# Patient Record
Sex: Female | Born: 1955 | Race: Black or African American | Hispanic: No | Smoking: Never smoker
Health system: Southern US, Community
[De-identification: ages and names within clinical notes are randomized; demographics above are authoritative.]

## PROBLEM LIST (undated history)

## (undated) DIAGNOSIS — I10 Essential (primary) hypertension: Secondary | ICD-10-CM

## (undated) DIAGNOSIS — N189 Chronic kidney disease, unspecified: Secondary | ICD-10-CM

## (undated) DIAGNOSIS — Z862 Personal history of diseases of the blood and blood-forming organs and certain disorders involving the immune mechanism: Secondary | ICD-10-CM

## (undated) DIAGNOSIS — M1990 Unspecified osteoarthritis, unspecified site: Secondary | ICD-10-CM

## (undated) DIAGNOSIS — D649 Anemia, unspecified: Secondary | ICD-10-CM

## (undated) HISTORY — PX: POLYPECTOMY: SHX149

## (undated) HISTORY — DX: Anemia, unspecified: D64.9

## (undated) HISTORY — PX: BREAST CYST EXCISION: SHX579

## (undated) HISTORY — PX: HERNIA REPAIR: SHX51

## (undated) HISTORY — DX: Chronic kidney disease, unspecified: N18.9

## (undated) HISTORY — PX: OTHER SURGICAL HISTORY: SHX169

## (undated) HISTORY — PX: KNEE SURGERY: SHX244

## (undated) HISTORY — PX: EXTREMITY CYST EXCISION: SHX1558

---

## 1998-03-13 ENCOUNTER — Emergency Department (HOSPITAL_COMMUNITY): Admission: EM | Admit: 1998-03-13 | Discharge: 1998-03-13 | Payer: Self-pay | Admitting: Emergency Medicine

## 1998-04-10 ENCOUNTER — Encounter: Admission: RE | Admit: 1998-04-10 | Discharge: 1998-06-12 | Payer: Self-pay | Admitting: Family Medicine

## 1998-05-26 ENCOUNTER — Encounter: Admission: RE | Admit: 1998-05-26 | Discharge: 1998-07-06 | Payer: Self-pay | Admitting: Orthopedic Surgery

## 1999-01-18 ENCOUNTER — Emergency Department (HOSPITAL_COMMUNITY): Admission: EM | Admit: 1999-01-18 | Discharge: 1999-01-19 | Payer: Self-pay | Admitting: Emergency Medicine

## 1999-01-19 ENCOUNTER — Encounter: Payer: Self-pay | Admitting: Emergency Medicine

## 1999-02-01 ENCOUNTER — Other Ambulatory Visit: Admission: RE | Admit: 1999-02-01 | Discharge: 1999-02-01 | Payer: Self-pay | Admitting: *Deleted

## 1999-02-22 ENCOUNTER — Ambulatory Visit (HOSPITAL_COMMUNITY): Admission: RE | Admit: 1999-02-22 | Discharge: 1999-02-22 | Payer: Self-pay | Admitting: *Deleted

## 1999-03-03 ENCOUNTER — Ambulatory Visit (HOSPITAL_COMMUNITY): Admission: RE | Admit: 1999-03-03 | Discharge: 1999-03-03 | Payer: Self-pay | Admitting: Orthopedic Surgery

## 2000-04-23 ENCOUNTER — Inpatient Hospital Stay (HOSPITAL_COMMUNITY): Admission: EM | Admit: 2000-04-23 | Discharge: 2000-04-27 | Payer: Self-pay | Admitting: Emergency Medicine

## 2000-04-23 ENCOUNTER — Encounter: Payer: Self-pay | Admitting: Emergency Medicine

## 2000-04-25 ENCOUNTER — Encounter: Payer: Self-pay | Admitting: Pulmonary Disease

## 2000-11-02 ENCOUNTER — Encounter: Payer: Self-pay | Admitting: Family Medicine

## 2000-11-02 ENCOUNTER — Encounter: Admission: RE | Admit: 2000-11-02 | Discharge: 2000-11-02 | Payer: Self-pay | Admitting: Family Medicine

## 2000-11-13 ENCOUNTER — Encounter: Admission: RE | Admit: 2000-11-13 | Discharge: 2000-11-13 | Payer: Self-pay | Admitting: Family Medicine

## 2000-11-13 ENCOUNTER — Encounter: Payer: Self-pay | Admitting: Family Medicine

## 2001-12-11 ENCOUNTER — Encounter (INDEPENDENT_AMBULATORY_CARE_PROVIDER_SITE_OTHER): Payer: Self-pay | Admitting: Cardiology

## 2001-12-11 ENCOUNTER — Ambulatory Visit (HOSPITAL_COMMUNITY): Admission: RE | Admit: 2001-12-11 | Discharge: 2001-12-11 | Payer: Self-pay | Admitting: Family Medicine

## 2004-03-05 ENCOUNTER — Ambulatory Visit (HOSPITAL_COMMUNITY): Admission: RE | Admit: 2004-03-05 | Discharge: 2004-03-05 | Payer: Self-pay | Admitting: *Deleted

## 2004-03-05 ENCOUNTER — Encounter (INDEPENDENT_AMBULATORY_CARE_PROVIDER_SITE_OTHER): Payer: Self-pay | Admitting: *Deleted

## 2005-07-21 ENCOUNTER — Emergency Department (HOSPITAL_COMMUNITY): Admission: EM | Admit: 2005-07-21 | Discharge: 2005-07-21 | Payer: Self-pay | Admitting: Emergency Medicine

## 2005-12-28 ENCOUNTER — Emergency Department (HOSPITAL_COMMUNITY): Admission: EM | Admit: 2005-12-28 | Discharge: 2005-12-28 | Payer: Self-pay | Admitting: Emergency Medicine

## 2006-06-06 ENCOUNTER — Emergency Department (HOSPITAL_COMMUNITY): Admission: EM | Admit: 2006-06-06 | Discharge: 2006-06-06 | Payer: Self-pay | Admitting: Emergency Medicine

## 2006-08-03 ENCOUNTER — Ambulatory Visit (HOSPITAL_COMMUNITY): Admission: RE | Admit: 2006-08-03 | Discharge: 2006-08-03 | Payer: Self-pay | Admitting: Family Medicine

## 2007-02-26 DIAGNOSIS — M67439 Ganglion, unspecified wrist: Secondary | ICD-10-CM | POA: Insufficient documentation

## 2007-12-22 ENCOUNTER — Emergency Department (HOSPITAL_COMMUNITY): Admission: EM | Admit: 2007-12-22 | Discharge: 2007-12-22 | Payer: Self-pay | Admitting: Emergency Medicine

## 2008-09-29 DIAGNOSIS — M179 Osteoarthritis of knee, unspecified: Secondary | ICD-10-CM | POA: Insufficient documentation

## 2009-06-18 ENCOUNTER — Encounter
Admission: RE | Admit: 2009-06-18 | Discharge: 2009-06-18 | Payer: Self-pay | Admitting: Physical Medicine and Rehabilitation

## 2010-07-02 NOTE — Op Note (Signed)
Renee Durham, Renee Durham             ACCOUNT NO.:  [id number]   MEDICAL RECORD NO.:  [id number]          PATIENT TYPE:  AMB   LOCATION:  SDC                           FACILITY:  [facility]   PHYSICIAN:  Pershing Cox, M.D.DATE OF BIRTH:  06/13/1955   DATE OF PROCEDURE:  03/05/2004  DATE OF DISCHARGE:                                 OPERATIVE REPORT   PREOPERATIVE DIAGNOSES:  1.  Menorrhagia.  2.  Hypertension.   POSTOPERATIVE DIAGNOSES:  1.  Menorrhagia.  2.  Hypertension.   PROCEDURES:  1.  Examination under anesthesia.  2.  Dilatation of the cervix and sounding of the uterine cavity.  3.  Diagnostic hysteroscopy.  4.  NovaSure endometrial ablation.   SURGEON:  Pershing Cox, M.D.  No assistants for this case.   ANESTHESIA:  General anesthesia by LMA and paracervical block.   INDICATION FOR PROCEDURE:  Renee Durham is a 55 year old female who has a  significant history of menorrhagia.  In preparation for her surgery, she was  treated with Lupron and became amenorrheic on this medication.  Her surgery  has been postponed several times because of her blood pressure.  She was  seen by the folks at Urgent Care in the last week, who felt that they had  her blood pressure under control and that we could proceed.  She has a long  history of hypertension and has been relatively lax about taking her  medications.  Hysterosalpingogram in my office showed a cavity with no  evidence of submucosal myomas, and for that reason we felt confident to  proceed with this operation.   PROCEDURE:  Renee Durham was brought to the operating room with an IV in  place.  She received 1 g of Ancef in the holding area.  Supine on the OR  table.  IV sedation was administered and an LMA was placed.  Once the  patient was completely anesthetized, she was placed into Allen stirrups and  prepped and draped for a sterile vaginal procedure.  Exam under anesthesia  was performed.  The uterus was  normal size and shape, and there were no  palpable adnexal masses.   A bivalve speculum was placed, the cervix was grasped with a single-tooth  tenaculum.  The Kevorkian curette was used to obtain endocervical  curettings.  The Hegar dilator was then used to sound the depth of the  cervical canal.  The uterine sound was then used to sound the endometrial  cavity.  These two lengths were subtracted from one another, and then the  depth of the cavity was set at 6.0.  Marcaine was then instilled into the  paracervical tissues.  The NovaSure apparatus was placed through the cervix  into the endometrial cavity.  We touched the top of the fundus and then  retreated 2 cm.  The array was opened in the endometrial cavity and then  situated by moving the apparatus up, down, side to side, oblique-angled and  then in a circular fashion.  We then tested the cavity to make sure there  was no evidence of perforation and when  the test passed, we then began the  coagulation procedure.  One  minute and one second were required to complete this.  The array was  successfully closed and the NovaSure was removed.  The hysteroscope was  introduced and the cavity was visualized.  A second picture was taken.  The  patient tolerated the procedure well, and she was taken to the recovery room  in good condition.     Maur   MAJ/MEDQ  D:  03/05/2004  T:  03/05/2004  Job:  [id number]

## 2010-07-02 NOTE — Discharge Summary (Signed)
[facility]  Patient:    Renee Durham, Renee Durham                    MRN: [id number] Adm. Date:  [id number] Disc. Date: 04/27/00 Attending:  Avie Echevaria Dictator:   Earley Favor, RN, MSN, ACNP CC:         Geraldo Pitter, M.D.   Discharge Summary  DATE OF BIRTH:  [birth year].  DISCHARGE DIAGNOSES: 1. Acute hypoxic respiratory failure. 2. Hypertension. 3. History of right adrenal mass.  HISTORY OF PRESENT ILLNESS:  The patient is a 55 year old black female who never smoked, lives in [place], works in a post office, who was found on April 21, 2000 at ______ [facility] on the floor complaining of shortness of breath with a cough and yellow sputum, with questionable duration of her acute event. She denied chest pains and headache.  She was admitted for further evaluation to the intensive care unit at [facility].  LABORATORY AND X-RAY FINDINGS:  Blood culture showed no growth in three days. Arterial blood gas on 4 L nasal cannula:  PCO2 of 39, PO2 of 73, pH 7.50 with a bicarb of 25.3.  WBC was 13.7, hematocrit 27.6, hemoglobin 8.9 and platelets of 368,000.  Sodium was 140, potassium 3.7, chloride of 112, CO2 was 23, glucose 112, BUN 23, creatinine 1.5, calcium 8.7.  ALT was 365, ALP was 87, total bilirubin was 1.5.  Lipase was 131.  CK reached a peak of 199.  CK-MB was 10.3.  TSH was 2.39.  Acute hepatic profile was negative.  Drug screen was negative.  Initial blood gas on 100% non-rebreather:  PCO2 of 34, PO2 of 73, pH 7.48.  Chest x-ray on April 25, 2000 shows resolving pulmonary edema with an enlarged cardiac silhouette.  CT of the head on April 25, 2000 showed no intracranial hemorrhage or cortical infarct.  Chest x-ray on April 24, 2000 shows slight improvement in aeration, bilateral airspace disease persisting.  Differential between pneumonia and vascular congestion persists.  Low lung volumes are still present.  Two-dimensional  echocardiogram performed on April 24, 2000 demonstrated left ventricle was small, left ventricular ejection fraction 65%, no left ventricular or regional wall motion abnormalities, normal mitral valve, mild right ventricular dilatation, excellent LV function.  There is significant LVH.  Twelve-lead EKG shows normal sinus rhythm without acute processes.  HOSPITAL COURSE: #1 - ACUTE HYPOXIC RESPIRATORY FAILURE:  The patient was admitted to [facility] in acute respiratory failure.  She initially required high-flow oxygen to maintain saturations greater than 92%.  She rapidly improved and by day three of hospitalization, no longer requiring supplemental oxygen, with O2 saturations of 93%.  She reached maximal hospital benefit by April 27, 2000 and was discharged home.  #2 - HYPERTENSION:  Ms. Thoreson has a known history of hypertension and is followed by Dr. Geraldo Pitter.  She has not taken any medications for her hypertension for approximately six months.  She was placed on clonidine initially at 0.1 mg b.i.d. and increased to 0.2 mg b.i.d., with her blood pressure coming down into the 140/80 range with a heart rate of 70.  She will be followed on an outpatient basis by Dr. Renaye Rakers to address her hypertension.  #3 - RIGHT POSTERIOR SUBSEROSAL FIBROMA:  She has a history of a right posterior subserosal uterine fibroid, probable mild adenomyosis.  This was evaluated by ______ in December 2000.  She has been instructed she  needs to follow up for further evaluation of this with ______ or with Dr. Parke Simmers in the future.  Of note, she has been instructed that if she continues her noncompliant behavior, she will continue to suffer further consequences of her actions.  DISCHARGE MEDICATIONS:  She should be on clonidine 0.2 mg one b.i.d.  DIET:  She should be on a low-fat, no-salt diet.  FOLLOWUP:  She is to schedule a followup with Dr. Parke Simmers within two weeks.  She will not be  required to follow up with pulmonary critical care at this time.  DISPOSITION/CONDITION ON DISCHARGE:  Acute respiratory hypoxia has been reversed.  Hypertension is now controlled.  She remains morbidly obese at last addressed weight of 276 pounds. DD:  04/27/00 TD:  04/27/00 Job: [id number]

## 2010-08-24 ENCOUNTER — Encounter: Payer: Self-pay | Admitting: Emergency Medicine

## 2010-08-24 ENCOUNTER — Emergency Department (INDEPENDENT_AMBULATORY_CARE_PROVIDER_SITE_OTHER): Payer: No Typology Code available for payment source

## 2010-08-24 ENCOUNTER — Emergency Department (HOSPITAL_BASED_OUTPATIENT_CLINIC_OR_DEPARTMENT_OTHER)
Admission: EM | Admit: 2010-08-24 | Discharge: 2010-08-24 | Disposition: A | Discharge status: Home / Self Care | Payer: No Typology Code available for payment source | Attending: Emergency Medicine | Admitting: Emergency Medicine

## 2010-08-24 DIAGNOSIS — S8990XA Unspecified injury of unspecified lower leg, initial encounter: Secondary | ICD-10-CM

## 2010-08-24 DIAGNOSIS — Z8739 Personal history of other diseases of the musculoskeletal system and connective tissue: Secondary | ICD-10-CM | POA: Insufficient documentation

## 2010-08-24 DIAGNOSIS — X500XXA Overexertion from strenuous movement or load, initial encounter: Secondary | ICD-10-CM

## 2010-08-24 DIAGNOSIS — S99929A Unspecified injury of unspecified foot, initial encounter: Secondary | ICD-10-CM

## 2010-08-24 DIAGNOSIS — S8390XA Sprain of unspecified site of unspecified knee, initial encounter: Secondary | ICD-10-CM

## 2010-08-24 DIAGNOSIS — IMO0002 Reserved for concepts with insufficient information to code with codable children: Secondary | ICD-10-CM | POA: Insufficient documentation

## 2010-08-24 DIAGNOSIS — I10 Essential (primary) hypertension: Secondary | ICD-10-CM | POA: Insufficient documentation

## 2010-08-24 HISTORY — DX: Essential (primary) hypertension: I10

## 2010-08-24 HISTORY — DX: Unspecified osteoarthritis, unspecified site: M19.90

## 2010-08-24 HISTORY — DX: Morbid (severe) obesity due to excess calories: E66.01

## 2010-08-24 MED ORDER — OXYCODONE-ACETAMINOPHEN 5-325 MG PO TABS: 2.0000 | ORAL_TABLET | ORAL | Status: AC | PRN

## 2010-08-24 NOTE — Discharge Instructions (Signed)
Knee Sprain You have a knee sprain. Sprains are painful injuries to the joints. They are a partial or complete tearing of ligaments. Ligaments are tough, fibrous tissues that hold bones together at the joints. A strain (sprain) has occurred when a ligament is stretched or damaged. This injury may take several weeks to heal. This is often the same length of time as a bone fracture (break in bone) takes to heal. Even though a fracture (bone break) may not have occurred, the recovery times may be similar. HOME CARE INSTRUCTIONS  Rest the injured area for 1 day. Then slowly start using the joint as directed by your caregiver and as the pain allows. Use crutches as directed. If the knee was splinted or casted, continue use and care as directed. If an ace bandage has been applied today, it should be removed and reapplied every 3 to 4 hours. It should not be applied tightly, but firmly enough to keep swelling down. Watch toes and feet for swelling, bluish discoloration, coldness, numbness or excessive pain. If any of these symptoms occur, remove the ace bandage and reapply more loosely. If these symptoms persist, seek medical attention.   For the first 24 hours, lie down. Keep the injured extremity elevated on two pillows.   Apply ice to the injured area for 10 minutes every couple hours. Repeat this 4 times per day for the first 48 hours. Put the ice in a plastic bag and place a towel between the bag of ice and your skin.   Wear any splinting, casting, or elastic bandage applications as instructed.   Only take over-the-counter or prescription medicines for pain, discomfort, or fever as directed by your caregiver. Do not use aspirin immediately after the injury unless instructed by your caregiver. Aspirin can cause increased bleeding and bruising of the tissues.   If you were given crutches, continue to use them as instructed. Do not resume weight bearing on the affected extremity until instructed.   1.  2. Persistent pain and inability to use the injured area as directed for more than 2 to 3 days are warning signs. If this happens you should see a caregiver for a follow-up visit as soon as possible. Initially, a hairline fracture (this is the same as a broken bone) may not be evident on x-rays. Persistent pain and swelling indicate that further evaluation, non-weight bearing (use of crutches as instructed), and/or further x-rays are indicated. X-rays may sometimes not show a small fracture until a week or ten days later. Make a follow-up appointment with your own caregiver or one to whom we have referred you. A radiologist (specialist in reading x-rays) may re-read your X-rays. Make sure you know how you are to get your x-ray results. Do not assume everything is normal if you do not hear from [place].  3.  SEEK MEDICAL CARE IF:  Bruising, swelling, or pain increases.   You have cold or numb toes   You have continuing difficulty or pain with walking.  1.  2. SEEK IMMEDIATE MEDICAL CARE IF:   Your toes are cold, numb or blue.   The pain is not responding to medications and continues to stay the same or get worse.  MAKE SURE YOU:   Understand these instructions.   Will watch your condition.   Will get help right away if you are not doing well or get worse.  Document Released: 01/31/2005 Document Re-Released: 04/29/2008 [facility] Patient Information 2011 [place], [place].

## 2010-08-24 NOTE — ED Provider Notes (Signed)
History     Chief Complaint  Patient presents with  . Knee Pain   Patient is a 55 y.o. female presenting with knee pain.  Knee Pain This is a recurrent problem. The current episode started less than 1 hour ago. The problem occurs constantly. The problem has not changed since onset.Pertinent negatives include no chest pain. The symptoms are aggravated by bending, twisting and walking.  right knee pain after twisting it getting in a car. History of knee problems and knee surgery.   Past Medical History  Diagnosis Date  . Hypertension   . Obesities, morbid   . Arthritis     Past Surgical History  Procedure Date  . Hernia repair   . Knee surgery   . Extremity cyst excision   . Breast cyst excision     History reviewed. No pertinent family history.  History  Substance Use Topics  . Smoking status: Never Smoker   . Smokeless tobacco: Not on file  . Alcohol Use: No    OB History    Grav Para Term Preterm Abortions TAB SAB Ect Mult Living                  Review of Systems  Constitutional: Negative for appetite change.  Cardiovascular: Negative for chest pain and leg swelling.  Musculoskeletal: Positive for joint swelling. Negative for back pain.  Neurological: Negative for weakness and numbness.    Physical Exam  BP 169/97  Pulse 89  Temp(Src) 98.1 F (36.7 C) (Oral)  Resp 18  SpO2 96%  Physical Exam  Constitutional: She appears well-developed.  Non-toxic appearance. She does not have a sickly appearance.  HENT:  Head: Normocephalic.  Musculoskeletal: She exhibits tenderness.       Right knee pain. Pain with movement. NV intact distally.     ED Course  Procedures  MDM Knee sprain. Xray no fracture. Patient too large for immobilizer. Pain meds.       Juliet Rude. Rubin Payor, MD 08/24/10 [phone]

## 2010-10-04 DIAGNOSIS — M545 Low back pain, unspecified: Secondary | ICD-10-CM | POA: Insufficient documentation

## 2010-11-17 LAB — CBC
HCT: 35.4 — ABNORMAL LOW
MCHC: 31.9
MCV: 71.9 — ABNORMAL LOW
Platelets: 307
RDW: 19.4 — ABNORMAL HIGH

## 2010-11-17 LAB — DIFFERENTIAL
Basophils Absolute: 0
Lymphocytes Relative: 23
Monocytes Relative: 10
Neutro Abs: 4.7

## 2010-11-17 LAB — COMPREHENSIVE METABOLIC PANEL
Albumin: 3.4 — ABNORMAL LOW
BUN: 9
Calcium: 8.7
Chloride: 109
Creatinine, Ser: 0.98
Total Bilirubin: 0.6
Total Protein: 6.5

## 2011-01-03 DIAGNOSIS — M25569 Pain in unspecified knee: Secondary | ICD-10-CM | POA: Insufficient documentation

## 2011-07-06 ENCOUNTER — Other Ambulatory Visit: Payer: Self-pay | Admitting: Physical Medicine and Rehabilitation

## 2011-07-06 DIAGNOSIS — M48061 Spinal stenosis, lumbar region without neurogenic claudication: Secondary | ICD-10-CM

## 2011-07-18 ENCOUNTER — Ambulatory Visit
Admission: RE | Admit: 2011-07-18 | Discharge: 2011-07-18 | Disposition: A | Discharge status: Home / Self Care | Payer: No Typology Code available for payment source | Source: Ambulatory Visit | Attending: Physical Medicine and Rehabilitation | Admitting: Physical Medicine and Rehabilitation

## 2011-07-18 DIAGNOSIS — M48061 Spinal stenosis, lumbar region without neurogenic claudication: Secondary | ICD-10-CM

## 2013-06-07 DIAGNOSIS — N183 Chronic kidney disease, stage 3 unspecified: Secondary | ICD-10-CM | POA: Insufficient documentation

## 2013-12-09 ENCOUNTER — Encounter: Payer: Self-pay | Admitting: Internal Medicine

## 2014-01-23 ENCOUNTER — Encounter: Payer: Self-pay | Admitting: Internal Medicine

## 2014-02-03 ENCOUNTER — Encounter: Payer: No Typology Code available for payment source | Admitting: Internal Medicine

## 2014-03-17 ENCOUNTER — Telehealth: Payer: Self-pay

## 2014-03-17 ENCOUNTER — Ambulatory Visit (AMBULATORY_SURGERY_CENTER): Payer: Self-pay | Admitting: *Deleted

## 2014-03-17 VITALS — Ht 61.5 in | Wt 320.0 lb

## 2014-03-17 DIAGNOSIS — Z1211 Encounter for screening for malignant neoplasm of colon: Secondary | ICD-10-CM

## 2014-03-17 MED ORDER — MOVIPREP 100 G PO SOLR: 1.0000 | Freq: Once | ORAL | Status: DC

## 2014-03-17 NOTE — Telephone Encounter (Signed)
Pt had previsit today and has BMI of 59 and needs to be scheduled at the hospital. Do you just want her scheduled for your next hospital week? Please advise.

## 2014-03-17 NOTE — Progress Notes (Signed)
Denies allergies to eggs or soy products. Denies complications with sedation or anesthesia. Denies O2 use. Denies use of diet or weight loss medications.  Emmi instructions not given for colonoscopy as patient does not have ready access to internet.

## 2014-03-17 NOTE — Telephone Encounter (Signed)
[place] for next hospital week with MAC

## 2014-03-19 ENCOUNTER — Other Ambulatory Visit: Payer: Self-pay

## 2014-03-19 DIAGNOSIS — Z1211 Encounter for screening for malignant neoplasm of colon: Secondary | ICD-10-CM

## 2014-03-19 NOTE — Telephone Encounter (Signed)
Pt scheduled for colon at [facility] 04/21/14@10 :30am. Pt to arrive there at 9am. Left message for pt to call back.

## 2014-03-19 NOTE — Telephone Encounter (Signed)
Spoke with pt and she is aware of appt, new instructions mailed to pt.

## 2014-04-07 ENCOUNTER — Encounter: Payer: No Typology Code available for payment source | Admitting: Internal Medicine

## 2014-04-15 ENCOUNTER — Encounter (HOSPITAL_COMMUNITY): Payer: Self-pay | Admitting: *Deleted

## 2014-04-21 ENCOUNTER — Ambulatory Visit (HOSPITAL_COMMUNITY): Payer: No Typology Code available for payment source | Admitting: Anesthesiology

## 2014-04-21 ENCOUNTER — Encounter (HOSPITAL_COMMUNITY): Payer: Self-pay | Admitting: Gastroenterology

## 2014-04-21 ENCOUNTER — Ambulatory Visit (HOSPITAL_COMMUNITY)
Admission: RE | Admit: 2014-04-21 | Discharge: 2014-04-21 | Disposition: A | Discharge status: Home / Self Care | Payer: No Typology Code available for payment source | Source: Ambulatory Visit | Attending: Internal Medicine | Admitting: Internal Medicine

## 2014-04-21 ENCOUNTER — Encounter (HOSPITAL_COMMUNITY)
Admission: RE | Disposition: A | Discharge status: Home / Self Care | Payer: Self-pay | Source: Ambulatory Visit | Attending: Internal Medicine

## 2014-04-21 DIAGNOSIS — M13861 Other specified arthritis, right knee: Secondary | ICD-10-CM | POA: Diagnosis not present

## 2014-04-21 DIAGNOSIS — D122 Benign neoplasm of ascending colon: Secondary | ICD-10-CM | POA: Insufficient documentation

## 2014-04-21 DIAGNOSIS — I10 Essential (primary) hypertension: Secondary | ICD-10-CM | POA: Diagnosis not present

## 2014-04-21 DIAGNOSIS — M4690 Unspecified inflammatory spondylopathy, site unspecified: Secondary | ICD-10-CM | POA: Diagnosis not present

## 2014-04-21 DIAGNOSIS — D127 Benign neoplasm of rectosigmoid junction: Secondary | ICD-10-CM | POA: Insufficient documentation

## 2014-04-21 DIAGNOSIS — Z6841 Body Mass Index (BMI) 40.0 and over, adult: Secondary | ICD-10-CM | POA: Diagnosis not present

## 2014-04-21 DIAGNOSIS — Z79899 Other long term (current) drug therapy: Secondary | ICD-10-CM | POA: Diagnosis not present

## 2014-04-21 DIAGNOSIS — M13862 Other specified arthritis, left knee: Secondary | ICD-10-CM | POA: Diagnosis not present

## 2014-04-21 DIAGNOSIS — K648 Other hemorrhoids: Secondary | ICD-10-CM | POA: Insufficient documentation

## 2014-04-21 DIAGNOSIS — Z1211 Encounter for screening for malignant neoplasm of colon: Secondary | ICD-10-CM | POA: Diagnosis not present

## 2014-04-21 HISTORY — DX: Personal history of diseases of the blood and blood-forming organs and certain disorders involving the immune mechanism: Z86.2

## 2014-04-21 HISTORY — PX: COLONOSCOPY: SHX5424

## 2014-04-21 SURGERY — COLONOSCOPY
Anesthesia: Monitor Anesthesia Care

## 2014-04-21 MED ORDER — LACTATED RINGERS IV SOLN
INTRAVENOUS | Status: DC | PRN
  Administered 2014-04-21: 11:00:00 via INTRAVENOUS

## 2014-04-21 MED ORDER — PROPOFOL 10 MG/ML IV BOLUS
INTRAVENOUS | Status: AC
  Filled 2014-04-21: qty 20

## 2014-04-21 MED ORDER — SODIUM CHLORIDE 0.9 % IV SOLN: INTRAVENOUS | Status: DC

## 2014-04-21 MED ORDER — LACTATED RINGERS IV SOLN: INTRAVENOUS | Status: DC

## 2014-04-21 MED ORDER — KETAMINE HCL 10 MG/ML IJ SOLN
INTRAMUSCULAR | Status: DC | PRN
  Administered 2014-04-21: 20 mg via INTRAVENOUS

## 2014-04-21 MED ORDER — ONDANSETRON HCL 4 MG/2ML IJ SOLN
INTRAMUSCULAR | Status: DC | PRN
  Administered 2014-04-21: 4 mg via INTRAVENOUS

## 2014-04-21 MED ORDER — PROPOFOL INFUSION 10 MG/ML OPTIME
INTRAVENOUS | Status: DC | PRN
  Administered 2014-04-21: 100 ug/kg/min via INTRAVENOUS

## 2014-04-21 NOTE — Anesthesia Preprocedure Evaluation (Signed)
Anesthesia Evaluation  Patient identified by MRN, date of birth, ID band Patient awake    Reviewed: Allergy & Precautions, NPO status , Patient's Chart, lab work & pertinent test results  Airway Mallampati: II  TM Distance: >3 FB Neck ROM: Full    Dental no notable dental hx.    Pulmonary neg pulmonary ROS,  breath sounds clear to auscultation  Pulmonary exam normal       Cardiovascular hypertension, Pt. on medications Rhythm:Regular Rate:Normal     Neuro/Psych negative neurological ROS  negative psych ROS   GI/Hepatic negative GI ROS, Neg liver ROS,   Endo/Other  Morbid obesity  Renal/GU negative Renal ROS  negative genitourinary   Musculoskeletal negative musculoskeletal ROS (+)   Abdominal   Peds negative pediatric ROS (+)  Hematology negative hematology ROS (+)   Anesthesia Other Findings   Reproductive/Obstetrics negative OB ROS                             Anesthesia Physical Anesthesia Plan  ASA: IV  Anesthesia Plan: MAC   Post-op Pain Management:    Induction:   Airway Management Planned: Simple Face Mask  Additional Equipment:   Intra-op Plan:   Post-operative Plan:   Informed Consent: I have reviewed the patients History and Physical, chart, labs and discussed the procedure including the risks, benefits and alternatives for the proposed anesthesia with the patient or authorized representative who has indicated his/her understanding and acceptance.   Dental advisory given  Plan Discussed with: CRNA  Anesthesia Plan Comments:         Anesthesia Quick Evaluation

## 2014-04-21 NOTE — Discharge Instructions (Signed)

## 2014-04-21 NOTE — Anesthesia Postprocedure Evaluation (Signed)
  Anesthesia Post-op Note  Patient: Renee Durham  Procedure(s) Performed: Procedure(s) (LRB): COLONOSCOPY (N/A)  Patient Location: PACU  Anesthesia Type: MAC  Level of Consciousness: awake and alert   Airway and Oxygen Therapy: Patient Spontanous Breathing  Post-op Pain: mild  Post-op Assessment: Post-op Vital signs reviewed, Patient's Cardiovascular Status Stable, Respiratory Function Stable, Patent Airway and No signs of Nausea or vomiting  Last Vitals:  Filed Vitals:   04/21/14 1220  BP: 143/91  Pulse: 52  Temp:   Resp: 10    Post-op Vital Signs: stable   Complications: No apparent anesthesia complications

## 2014-04-21 NOTE — H&P (Signed)
HPI: Renee Durham is a 59 year old female with a past history of morbid obesity, hypertension who presents today for outpatient colonoscopy for screening. This procedure is being performed in the outpatient hospital setting due to morbid obesity and BMI greater than 50. She reports normal bowel habits without change in her bowel habits, no blood in her stool or melena. No diarrhea or constipation. No family history of colorectal cancer. She has never had a colonoscopy. She reports history of prior umbilical hernia repair which was uncomplicated  Past Medical History  Diagnosis Date  . Hypertension   . Obesities, morbid   . Arthritis     knees/back  . H/O sickle cell trait     Past Surgical History  Procedure Laterality Date  . Knee surgery      scope  . Extremity cyst excision Right     right wrist  . Breast cyst excision    . Uterine ablation    . Hernia repair      umbilical     (Not in an outpatient encounter)  No Known Allergies  Family History  Problem Relation Age of Onset  . Colon cancer Neg Hx   . Esophageal cancer Neg Hx   . Rectal cancer Neg Hx   . Stomach cancer Neg Hx     History  Substance Use Topics  . Smoking status: Never Smoker   . Smokeless tobacco: Never Used  . Alcohol Use: No    ROS: As per history of present illness, otherwise negative  BP 153/79 mmHg  Pulse 62  Temp(Src) 97.5 F (36.4 C) (Oral)  Resp 24  Ht 5' 1"  (1.549 m)  Wt 320 lb (145.151 kg)  BMI 60.49 kg/m2  SpO2 98% Gen: awake, alert, NAD HEENT: anicteric, op clear CV: RRR, no mrg Pulm: CTA b/l Abd: soft, obese, NT/ND, +BS throughout Ext: no c/c/e Neuro: nonfocal   RELEVANT LABS AND IMAGING: CBC    Component Value Date/Time   WBC 7.4 12/22/2007 1825   RBC 4.92 12/22/2007 1825   HGB 11.3* 12/22/2007 1825   HCT 35.4* 12/22/2007 1825   PLT 307 12/22/2007 1825   MCV 71.9* 12/22/2007 1825   MCHC 31.9 12/22/2007 1825   RDW 19.4* 12/22/2007 1825   LYMPHSABS 1.7  12/22/2007 1825   MONOABS 0.7 12/22/2007 1825   EOSABS 0.3 12/22/2007 1825   BASOSABS 0.0 12/22/2007 1825    CMP     Component Value Date/Time   NA 141 12/22/2007 1825   K 3.9 12/22/2007 1825   CL 109 12/22/2007 1825   CO2 29 12/22/2007 1825   GLUCOSE 103* 12/22/2007 1825   BUN 9 12/22/2007 1825   CREATININE 0.98 12/22/2007 1825   CALCIUM 8.7 12/22/2007 1825   PROT 6.5 12/22/2007 1825   ALBUMIN 3.4* 12/22/2007 1825   AST 16 12/22/2007 1825   ALT 13 12/22/2007 1825   ALKPHOS 80 12/22/2007 1825   BILITOT 0.6 12/22/2007 1825   GFRNONAA >60 12/22/2007 1825   GFRAA  12/22/2007 1825    >60        The eGFR has been calculated using the MDRD equation. This calculation has not been validated in all clinical    ASSESSMENT/PLAN: 59 year old present for outpatient screening colonoscopy  1. CRC screening -- colonoscopy plan today for screening. This is her first colonoscopy.The nature of the procedure, as well as the risks, benefits, and alternatives were carefully and thoroughly reviewed with the patient. Ample time for discussion and questions allowed. The patient understood,  was satisfied, and agreed to proceed.

## 2014-04-21 NOTE — Op Note (Signed)
[facility] [place], [address]   COLONOSCOPY PROCEDURE REPORT  PATIENT: Renee, Durham  MR#: [id number] BIRTHDATE: March 25, 1955 , 46  yrs. old GENDER: female ENDOSCOPIST: Jerene Bears, MD REFERRED DD:UKGUR Bouska, M.D. PROCEDURE DATE:  04/21/2014 PROCEDURE:   Colonoscopy with snare polypectomy First Screening Colonoscopy - Avg.  risk and is 50 yrs.  old or older Yes.  Prior Negative Screening - Now for repeat screening. N/A  History of Adenoma - Now for follow-up colonoscopy & has been > or = to 3 yrs.  N/A  Polyps Removed Today? Yes. ASA CLASS:   Class III INDICATIONS:average risk patient for colon cancer and 1st colonoscopy. MEDICATIONS: Monitored anesthesia care and Per Anesthesia  DESCRIPTION OF PROCEDURE:   After the risks benefits and alternatives of the procedure were thoroughly explained, informed consent was obtained.  The digital rectal exam revealed no rectal mass.   The Pentax Ped Colon [id number]  endoscope was introduced through the anus and advanced to the cecum, which was identified by both the appendix and ileocecal valve. No adverse events experienced.   The quality of the prep was , using MoviPrep good. The instrument was then slowly withdrawn as the colon was fully examined.  COLON FINDINGS: A sessile polyp measuring 8 mm in size with a mucous cap was found in the ascending colon.  A polypectomy was performed with a cold snare.  The resection was complete, the polyp tissue was completely retrieved and sent to histology.   Three sessile polyps ranging from 4 to 85mm in size were found in the rectosigmoid colon.  Polypectomies were performed with a cold snare.  The resection was complete, the polyp tissue was completely retrieved and sent to histology.   The examination was otherwise normal. Retroflexed views revealed internal hemorrhoids. The time to cecum = 11 Withdrawal time = 14   The scope was withdrawn and the procedure  completed. COMPLICATIONS: There were no immediate complications.  ENDOSCOPIC IMPRESSION: 1.   Sessile polyp was found in the ascending colon; polypectomy was performed with a cold snare 2.   Three sessile polyps ranging from 4 to 43mm in size were found in the rectosigmoid colon; polypectomies were performed with a cold snare 3.   The examination was otherwise normal  RECOMMENDATIONS: 1.  Avoid all NSAIDS for the next 2 weeks. 2.  Await pathology results 3.  Timing of repeat colonoscopy will be determined by pathology findings. 4.  You will receive a letter within 1-2 weeks with the results of your biopsy as well as final recommendations.  Please call my office if you have not received a letter after 3 weeks.  eSigned:  Jerene Bears, MD 04/21/2014 11:35 AMrcc: Bernerd Limbo, MD and The Patient

## 2014-04-21 NOTE — Transfer of Care (Signed)
Immediate Anesthesia Transfer of Care Note  Patient: Renee Durham  Procedure(s) Performed: Procedure(s): COLONOSCOPY (N/A)  Patient Location: Endoscopy Unit  Anesthesia Type:MAC  Level of Consciousness: awake, alert  and oriented  Airway & Oxygen Therapy: Patient Spontanous Breathing and Patient connected to face mask oxygen  Post-op Assessment: Report given to RN  Post vital signs: Reviewed and stable  Last Vitals:  Filed Vitals:   04/21/14 1024  BP: 153/79  Pulse: 62  Temp: 36.4 C  Resp: 24    Complications: No apparent anesthesia complications

## 2014-04-22 ENCOUNTER — Encounter (HOSPITAL_COMMUNITY): Payer: Self-pay | Admitting: Internal Medicine

## 2014-04-24 ENCOUNTER — Encounter: Payer: Self-pay | Admitting: Internal Medicine

## 2016-02-11 DIAGNOSIS — Z6841 Body Mass Index (BMI) 40.0 and over, adult: Secondary | ICD-10-CM | POA: Insufficient documentation

## 2016-02-11 DIAGNOSIS — R609 Edema, unspecified: Secondary | ICD-10-CM | POA: Insufficient documentation

## 2016-08-12 NOTE — Congregational Nurse Program (Signed)
Congregational Nurse Program Note  Date of Encounter: 08/12/2016  Past Medical History: Past Medical History:  Diagnosis Date  . Arthritis    knees/back  . H/O sickle cell trait   . Hypertension   . Obesities, morbid     Encounter Details:     CNP Questionnaire - 08/12/16 1427      Patient Demographics   Is this a new or existing patient? New   Patient is considered a/an Not Applicable   Race African-American/Black     Patient Assistance   Location of Patient Assistance Not Applicable   Patient's financial/insurance status Private Insurance Coverage   Uninsured Patient ([place] [employer]s) No   Patient referred to apply for the following financial assistance Not Applicable   Food insecurities addressed Not Applicable   Transportation assistance No   Assistance securing medications No   Educational health offerings Exercise/physical activity     Encounter Details   Primary purpose of visit Education/Health Concerns   Was an Emergency Department visit averted? Not Applicable   Does patient have a medical provider? Yes   Patient referred to Not Applicable   Was a mental health screening completed? (GAINS tool) No   Does patient have dental issues? No   Does patient have vision issues? No   Does your patient have an abnormal blood pressure today? No   Since previous encounter, have you referred patient for abnormal blood pressure that resulted in a new diagnosis or medication change? No   Does your patient have an abnormal blood glucose today? No   Since previous encounter, have you referred patient for abnormal blood glucose that resulted in a new diagnosis or medication change? No   Was there a life-saving intervention made? No    Attended chair yoga class.

## 2016-12-28 DIAGNOSIS — N3946 Mixed incontinence: Secondary | ICD-10-CM | POA: Insufficient documentation

## 2016-12-28 DIAGNOSIS — N393 Stress incontinence (female) (male): Secondary | ICD-10-CM | POA: Insufficient documentation

## 2017-05-11 ENCOUNTER — Telehealth: Payer: Self-pay | Admitting: *Deleted

## 2017-05-11 ENCOUNTER — Other Ambulatory Visit: Payer: Self-pay | Admitting: Internal Medicine

## 2017-05-11 DIAGNOSIS — Z86010 Personal history of colonic polyps: Secondary | ICD-10-CM

## 2017-05-11 NOTE — Telephone Encounter (Signed)
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 Her procedure will need to be completed at [facility] due to BMI of ~62 as last visit on 12/28/16.   I have contacted patient who is agreeable to having procedure. She has been scheduled for colonoscopy on 08/08/17 at 945 am with 815 am arrival to [facility]. She is also scheduled for previsit for instructions on prep for 07/17/17 at 10am. Patient verbalizes understanding of time/date/location of both appointments.

## 2017-07-17 ENCOUNTER — Ambulatory Visit (AMBULATORY_SURGERY_CENTER): Payer: Self-pay | Admitting: *Deleted

## 2017-07-17 ENCOUNTER — Other Ambulatory Visit: Payer: Self-pay

## 2017-07-17 VITALS — Ht 62.0 in | Wt 333.0 lb

## 2017-07-17 DIAGNOSIS — Z86010 Personal history of colonic polyps: Secondary | ICD-10-CM

## 2017-07-17 MED ORDER — NA SULFATE-K SULFATE-MG SULF 17.5-3.13-1.6 GM/177ML PO SOLN: 1.0000 | Freq: Once | ORAL | 0 refills | Status: AC

## 2017-07-17 NOTE — Progress Notes (Signed)
No egg or soy allergy known to patient  No issues with past sedation with any surgeries  or procedures, no intubation problems  No diet pills per patient No home 02 use per patient  No blood thinners per patient  Pt denies issues with constipation  No A fib or A flutter  EMMI video sent to pt's e mail  $15 suprep coupon to pt in [place]

## 2017-08-07 NOTE — Progress Notes (Signed)
Attempted to call patient twice on 08/07/2017. Left basic instructions on machine with time and number to call back.

## 2017-08-08 ENCOUNTER — Encounter (HOSPITAL_COMMUNITY): Payer: Self-pay | Admitting: Anesthesiology

## 2017-08-08 ENCOUNTER — Ambulatory Visit (HOSPITAL_COMMUNITY): Payer: 59 | Admitting: Anesthesiology

## 2017-08-08 ENCOUNTER — Encounter (HOSPITAL_COMMUNITY)
Admission: RE | Disposition: A | Discharge status: Home / Self Care | Payer: Self-pay | Source: Ambulatory Visit | Attending: Internal Medicine

## 2017-08-08 ENCOUNTER — Other Ambulatory Visit: Payer: Self-pay

## 2017-08-08 ENCOUNTER — Ambulatory Visit (HOSPITAL_COMMUNITY)
Admission: RE | Admit: 2017-08-08 | Discharge: 2017-08-08 | Disposition: A | Discharge status: Home / Self Care | Payer: 59 | Source: Ambulatory Visit | Attending: Internal Medicine | Admitting: Internal Medicine

## 2017-08-08 DIAGNOSIS — D573 Sickle-cell trait: Secondary | ICD-10-CM | POA: Insufficient documentation

## 2017-08-08 DIAGNOSIS — Z1211 Encounter for screening for malignant neoplasm of colon: Secondary | ICD-10-CM | POA: Insufficient documentation

## 2017-08-08 DIAGNOSIS — K621 Rectal polyp: Secondary | ICD-10-CM | POA: Insufficient documentation

## 2017-08-08 DIAGNOSIS — D123 Benign neoplasm of transverse colon: Secondary | ICD-10-CM | POA: Insufficient documentation

## 2017-08-08 DIAGNOSIS — Z86010 Personal history of colonic polyps: Secondary | ICD-10-CM | POA: Insufficient documentation

## 2017-08-08 DIAGNOSIS — Z538 Procedure and treatment not carried out for other reasons: Secondary | ICD-10-CM

## 2017-08-08 DIAGNOSIS — N189 Chronic kidney disease, unspecified: Secondary | ICD-10-CM | POA: Insufficient documentation

## 2017-08-08 DIAGNOSIS — I129 Hypertensive chronic kidney disease with stage 1 through stage 4 chronic kidney disease, or unspecified chronic kidney disease: Secondary | ICD-10-CM | POA: Diagnosis not present

## 2017-08-08 DIAGNOSIS — Z6841 Body Mass Index (BMI) 40.0 and over, adult: Secondary | ICD-10-CM | POA: Insufficient documentation

## 2017-08-08 HISTORY — PX: POLYPECTOMY: SHX5525

## 2017-08-08 HISTORY — PX: BIOPSY: SHX5522

## 2017-08-08 HISTORY — PX: COLONOSCOPY WITH PROPOFOL: SHX5780

## 2017-08-08 SURGERY — COLONOSCOPY WITH PROPOFOL
Anesthesia: Monitor Anesthesia Care

## 2017-08-08 MED ORDER — LACTATED RINGERS IV SOLN
INTRAVENOUS | Status: DC | PRN
  Administered 2017-08-08: 1000 mL
  Administered 2017-08-08: 09:00:00 via INTRAVENOUS

## 2017-08-08 MED ORDER — ONDANSETRON HCL 4 MG/2ML IJ SOLN
INTRAMUSCULAR | Status: DC | PRN
  Administered 2017-08-08: 4 mg via INTRAVENOUS

## 2017-08-08 MED ORDER — PROPOFOL 10 MG/ML IV BOLUS
INTRAVENOUS | Status: AC
  Filled 2017-08-08: qty 60

## 2017-08-08 MED ORDER — MIDAZOLAM HCL 5 MG/5ML IJ SOLN
INTRAMUSCULAR | Status: DC | PRN
  Administered 2017-08-08: 2 mg via INTRAVENOUS

## 2017-08-08 MED ORDER — MIDAZOLAM HCL 2 MG/2ML IJ SOLN
INTRAMUSCULAR | Status: AC
  Filled 2017-08-08: qty 2

## 2017-08-08 MED ORDER — PROPOFOL 500 MG/50ML IV EMUL
INTRAVENOUS | Status: DC | PRN
  Administered 2017-08-08: 75 ug/kg/min via INTRAVENOUS

## 2017-08-08 MED ORDER — SODIUM CHLORIDE 0.9 % IV SOLN: INTRAVENOUS | Status: DC

## 2017-08-08 MED ORDER — PROPOFOL 500 MG/50ML IV EMUL
INTRAVENOUS | Status: DC | PRN
  Administered 2017-08-08: 30 mg via INTRAVENOUS

## 2017-08-08 SURGICAL SUPPLY — 21 items

## 2017-08-08 NOTE — Op Note (Signed)
[facility] Patient Name: Renee Durham Procedure Date: 08/08/2017 MRN: [id number] Attending MD: Jerene Bears , MD Date of Birth: 31-Oct-1955 CSN: [id number] Age: 62 Admit Type: Outpatient Procedure:                Colonoscopy Indications:              High risk colon cancer surveillance: Personal                            history of sessile serrated colon polyp (less than                            10 mm in size) with no dysplasia, Last colonoscopy                            3 years ago Providers:                Lajuan Lines. Hilarie Fredrickson, MD, Angus Seller, Charolette Child,                            Technician, Arnoldo Hooker, CRNA Referring MD:             Bartholome Bill Medicines:                Monitored Anesthesia Care Complications:            No immediate complications. Estimated Blood Loss:     Estimated blood loss was minimal. Procedure:                Pre-Anesthesia Assessment:                           - Prior to the procedure, a History and Physical                            was performed, and patient medications and                            allergies were reviewed. The patient's tolerance of                            previous anesthesia was also reviewed. The risks                            and benefits of the procedure and the sedation                            options and risks were discussed with the patient.                            All questions were answered, and informed consent                            was obtained. Prior Anticoagulants: The patient has  taken no previous anticoagulant or antiplatelet                            agents. ASA Grade Assessment: III - A patient with                            severe systemic disease. After reviewing the risks                            and benefits, the patient was deemed in                            satisfactory condition to undergo the procedure.  After obtaining informed consent, the colonoscope                            (attempts with both pediatric and adult                            colonoscope) was passed under direct vision.                            Throughout the procedure, the patient's blood                            pressure, pulse, and oxygen saturations were                            monitored continuously. The was introduced through                            the anus and advanced to the proximal transverse                            colon. The colonoscopy was performed with                            difficulty due to significant looping. Unsuccessful                            completion of the procedure despite using manual                            pressure, withdrawing and reinserting the scope and                            straightening and shortening the scope to obtain                            bowel loop reduction. The patient tolerated the                            procedure well. The quality of the bowel  preparation was excellent. The rectum was                            photographed. Scope In: 9:35:39 AM Scope Out: 10:15:14 AM Total Procedure Duration: 0 hours 39 minutes 35 seconds  Findings:      The digital rectal exam was normal.      The sigmoid colon and descending colon revealed significantly excessive       looping. Procedure incomplete (cecum, ascending colon, and hepatic       flexure not visualized).      A 3 mm polyp was found in the distal transverse colon. The polyp was       sessile. The polyp was removed with a cold biopsy forceps. Resection and       retrieval were complete.      Two sessile polyps were found in the rectum. The polyps were 4 to 6 mm       in size. These polyps were removed with a cold snare. Resection and       retrieval were complete.      The retroflexed view of the distal rectum and anal verge was normal and       showed no anal or  rectal abnormalities. Impression:               - There was significant looping of the colon.                            Incomplete examination.                           - One 3 mm polyp in the distal transverse colon,                            removed with a cold biopsy forceps. Resected and                            retrieved.                           - Two 4 to 6 mm polyps in the rectum, removed with                            a cold snare. Resected and retrieved.                           - The distal rectum and anal verge are normal on                            retroflexion view. Moderate Sedation:      N/A Recommendation:           - Patient has a contact number available for                            emergencies. The signs and symptoms of potential  delayed complications were discussed with the                            patient. Return to normal activities tomorrow.                            Written discharge instructions were provided to the                            patient.                           - Resume previous diet.                           - Continue present medications.                           - Referral to [facility] for repeat colonoscopy at                            appointment to be scheduled because the examination                            was incomplete. Repeat colonoscopy is recommended                            versus another modality such as virtual colonoscopy                            due to patient's personal history of right-sided                            sessile serrated polyps. Procedure Code(s):        --- Professional ---                           [id number], 52, Colonoscopy, flexible; with removal of                            tumor(s), polyp(s), or other lesion(s) by snare                            technique                           45380, 59,52, Colonoscopy, flexible; with biopsy,                             single or multiple Diagnosis Code(s):        --- Professional ---                           Z86.010, Personal history of colonic polyps  D12.3, Benign neoplasm of transverse colon (hepatic                            flexure or splenic flexure)                           K62.1, Rectal polyp CPT copyright 2017 American Medical Association. All rights reserved. The codes documented in this report are preliminary and upon coder review may  be revised to meet current compliance requirements. Jerene Bears, MD 08/08/2017 10:32:09 AM This report has been signed electronically. Number of Addenda: 0

## 2017-08-08 NOTE — Anesthesia Preprocedure Evaluation (Signed)
Anesthesia Evaluation  Patient identified by MRN, date of birth, ID band Patient awake    Reviewed: Allergy & Precautions, NPO status , Patient's Chart, lab work & pertinent test results  Airway Mallampati: II  TM Distance: >3 FB Neck ROM: Full    Dental no notable dental hx.    Pulmonary neg pulmonary ROS,    breath sounds clear to auscultation + decreased breath sounds      Cardiovascular hypertension, Pt. on medications and Pt. on home beta blockers Normal cardiovascular exam Rhythm:Regular Rate:Normal     Neuro/Psych negative neurological ROS  negative psych ROS   GI/Hepatic negative GI ROS, Neg liver ROS,   Endo/Other  Morbid obesity  Renal/GU negative Renal ROS  negative genitourinary   Musculoskeletal negative musculoskeletal ROS (+)   Abdominal (+) + obese,   Peds negative pediatric ROS (+)  Hematology  (+) Sickle cell trait ,   Anesthesia Other Findings   Reproductive/Obstetrics negative OB ROS                             Anesthesia Physical Anesthesia Plan  ASA: IV  Anesthesia Plan: MAC   Post-op Pain Management:    Induction: Intravenous  PONV Risk Score and Plan: 0 and Treatment may vary due to age or medical condition  Airway Management Planned: Simple Face Mask  Additional Equipment:   Intra-op Plan:   Post-operative Plan:   Informed Consent: I have reviewed the patients History and Physical, chart, labs and discussed the procedure including the risks, benefits and alternatives for the proposed anesthesia with the patient or authorized representative who has indicated his/her understanding and acceptance.   Dental advisory given  Plan Discussed with: CRNA and Surgeon  Anesthesia Plan Comments:         Anesthesia Quick Evaluation

## 2017-08-08 NOTE — Transfer of Care (Signed)
Immediate Anesthesia Transfer of Care Note  Patient: Renee Durham  Procedure(s) Performed: Procedure(s): COLONOSCOPY WITH PROPOFOL (N/A)  Patient Location: PACU  Anesthesia Type:MAC  Level of Consciousness:  sedated, patient cooperative and responds to stimulation  Airway & Oxygen Therapy:Patient Spontanous Breathing and Patient connected to face mask oxgen  Post-op Assessment:  Report given to PACU RN and Post -op Vital signs reviewed and stable  Post vital signs:  Reviewed and stable  Last Vitals:  Vitals:   08/08/17 0835  BP: (!) 169/77  Pulse: 63  Resp: 19  Temp: 36.4 C  SpO2: 92%    Complications: No apparent anesthesia complications

## 2017-08-08 NOTE — Anesthesia Postprocedure Evaluation (Signed)
Anesthesia Post Note  Patient: Renee Durham  Procedure(s) Performed: COLONOSCOPY WITH PROPOFOL (N/A )     Patient location during evaluation: PACU Anesthesia Type: MAC Level of consciousness: awake and alert Pain management: pain level controlled Vital Signs Assessment: post-procedure vital signs reviewed and stable Respiratory status: spontaneous breathing, nonlabored ventilation, respiratory function stable and patient connected to nasal cannula oxygen Cardiovascular status: stable and blood pressure returned to baseline Postop Assessment: no apparent nausea or vomiting Anesthetic complications: no    Last Vitals:  Vitals:   08/08/17 0835 08/08/17 1021  BP: (!) 169/77 140/67  Pulse: 63 (!) 59  Resp: 19 19  Temp: 36.4 C 36.6 C  SpO2: 97% 99%    Last Pain:  Vitals:   08/08/17 1021  TempSrc: Oral  PainSc: 0-No pain                 Aldona Bryner S

## 2017-08-08 NOTE — H&P (Signed)
HPI: Renee Durham is a 62 year old female with a past medical history of sessile serrated polyps 3 years ago at initial screening colonoscopy, obesity, hypertension chronic kidney disease who returns for outpatient surveillance colonoscopy.  Procedure being performed in the outpatient hospital setting due to BMI greater than 50 kg/m2.    She reports no GI complaint today.  Tolerated the bowel prep well.  No abdominal pain.  No recent change in bowel habit, diarrhea, constipation, blood in stool or melena.  Denies chest pain and dyspnea today.  Her daughter is here to drive her home.  Past Medical History:  Diagnosis Date  . Anemia   . Arthritis    knees/back  . Chronic kidney disease   . H/O sickle cell trait   . Hypertension   . Obesities, morbid [facility])     Past Surgical History:  Procedure Laterality Date  . BREAST CYST EXCISION    . COLONOSCOPY N/A 04/21/2014   Procedure: COLONOSCOPY;  Surgeon: Jerene Bears, MD;  Location: [facility] ENDOSCOPY;  Service: Gastroenterology;  Laterality: N/A;  . EXTREMITY CYST EXCISION Right    right wrist  . HERNIA REPAIR     umbilical  . KNEE SURGERY     scope  . POLYPECTOMY    . uterine ablation       (Not in an outpatient encounter)  No Known Allergies  Family History  Problem Relation Age of Onset  . Breast cancer Sister   . Breast cancer Sister   . Colon cancer Neg Hx   . Esophageal cancer Neg Hx   . Rectal cancer Neg Hx   . Stomach cancer Neg Hx     Social History   Tobacco Use  . Smoking status: Never Smoker  . Smokeless tobacco: Never Used  Substance Use Topics  . Alcohol use: No    Alcohol/week: 0.0 oz  . Drug use: No    ROS: As per history of present illness, otherwise negative  BP (!) 169/77   Pulse 63   Temp 97.6 F (36.4 C) (Oral)   Resp 19   Ht 5\' 2"  (1.575 m)   Wt (!) 330 lb (149.7 kg)   SpO2 97%   BMI 60.36 kg/m  Gen: awake, alert, NAD HEENT: anicteric, op clear CV: RRR, no mrg Pulm: CTA b/l Abd:  soft, obese, NT/ND, +BS throughout Ext: no c/c/e Neuro: nonfocal   ASSESSMENT/PLAN: 62 year old female with a past medical history of sessile serrated polyps 3 years ago at initial screening colonoscopy, obesity, hypertension chronic kidney disease who returns for outpatient surveillance colonoscopy.  1.  History of sessile serrated polyps --for surveillance colonoscopy today with monitored anesthesia care. The nature of the procedure, as well as the risks, benefits, and alternatives were carefully and thoroughly reviewed with the patient. Ample time for discussion and questions allowed. The patient understood, was satisfied, and agreed to proceed.

## 2017-08-08 NOTE — Discharge Instructions (Signed)

## 2017-08-09 ENCOUNTER — Encounter (HOSPITAL_COMMUNITY): Payer: Self-pay | Admitting: Internal Medicine

## 2017-08-09 ENCOUNTER — Encounter: Payer: Self-pay | Admitting: Internal Medicine

## 2017-08-10 ENCOUNTER — Other Ambulatory Visit: Payer: Self-pay | Admitting: *Deleted

## 2017-08-10 ENCOUNTER — Telehealth: Payer: Self-pay | Admitting: *Deleted

## 2017-08-10 DIAGNOSIS — Z1211 Encounter for screening for malignant neoplasm of colon: Secondary | ICD-10-CM

## 2017-08-10 NOTE — Telephone Encounter (Signed)
Patient aware of referral being made to [facility]. Faxed all records to [facility]. Patient will call back if she does not hear from [place] in the next couple of weeks.

## 2017-11-14 ENCOUNTER — Other Ambulatory Visit: Payer: Self-pay

## 2017-11-14 ENCOUNTER — Emergency Department (HOSPITAL_COMMUNITY)
Admission: EM | Admit: 2017-11-14 | Discharge: 2017-11-14 | Disposition: A | Discharge status: Home / Self Care | Payer: 59 | Attending: Emergency Medicine | Admitting: Emergency Medicine

## 2017-11-14 ENCOUNTER — Encounter (HOSPITAL_COMMUNITY): Payer: Self-pay | Admitting: Emergency Medicine

## 2017-11-14 ENCOUNTER — Emergency Department (HOSPITAL_COMMUNITY): Payer: 59

## 2017-11-14 DIAGNOSIS — D127 Benign neoplasm of rectosigmoid junction: Secondary | ICD-10-CM | POA: Diagnosis not present

## 2017-11-14 DIAGNOSIS — Y92410 Unspecified street and highway as the place of occurrence of the external cause: Secondary | ICD-10-CM | POA: Insufficient documentation

## 2017-11-14 DIAGNOSIS — Y999 Unspecified external cause status: Secondary | ICD-10-CM | POA: Diagnosis not present

## 2017-11-14 DIAGNOSIS — Z7982 Long term (current) use of aspirin: Secondary | ICD-10-CM | POA: Insufficient documentation

## 2017-11-14 DIAGNOSIS — Y9389 Activity, other specified: Secondary | ICD-10-CM | POA: Insufficient documentation

## 2017-11-14 DIAGNOSIS — Z79899 Other long term (current) drug therapy: Secondary | ICD-10-CM | POA: Diagnosis not present

## 2017-11-14 DIAGNOSIS — N189 Chronic kidney disease, unspecified: Secondary | ICD-10-CM | POA: Insufficient documentation

## 2017-11-14 DIAGNOSIS — D123 Benign neoplasm of transverse colon: Secondary | ICD-10-CM | POA: Insufficient documentation

## 2017-11-14 DIAGNOSIS — M7918 Myalgia, other site: Secondary | ICD-10-CM

## 2017-11-14 DIAGNOSIS — M25531 Pain in right wrist: Secondary | ICD-10-CM | POA: Diagnosis not present

## 2017-11-14 DIAGNOSIS — M25511 Pain in right shoulder: Secondary | ICD-10-CM | POA: Diagnosis not present

## 2017-11-14 DIAGNOSIS — D122 Benign neoplasm of ascending colon: Secondary | ICD-10-CM | POA: Diagnosis not present

## 2017-11-14 DIAGNOSIS — M79662 Pain in left lower leg: Secondary | ICD-10-CM | POA: Diagnosis not present

## 2017-11-14 DIAGNOSIS — I129 Hypertensive chronic kidney disease with stage 1 through stage 4 chronic kidney disease, or unspecified chronic kidney disease: Secondary | ICD-10-CM | POA: Diagnosis not present
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
## 2017-11-14 NOTE — Discharge Instructions (Signed)
Home to rest, apply warm and cold compresses alternating for 20 minutes at a time to sore muscles.  Take your medications as currently prescribed.  Recheck with your family doctor, return to ER for worsening or concerning symptoms.
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
## 2017-12-04 ENCOUNTER — Ambulatory Visit (INDEPENDENT_AMBULATORY_CARE_PROVIDER_SITE_OTHER): Payer: No Typology Code available for payment source | Admitting: Family Medicine

## 2017-12-04 ENCOUNTER — Encounter (INDEPENDENT_AMBULATORY_CARE_PROVIDER_SITE_OTHER): Payer: Self-pay | Admitting: Family Medicine

## 2017-12-04 DIAGNOSIS — G8929 Other chronic pain: Secondary | ICD-10-CM

## 2017-12-04 DIAGNOSIS — M545 Low back pain, unspecified: Secondary | ICD-10-CM

## 2017-12-04 MED ORDER — BACLOFEN 10 MG PO TABS: 10.0000 mg | ORAL_TABLET | Freq: Three times a day (TID) | ORAL | 1 refills | Status: DC | PRN

## 2017-12-04 MED ORDER — DICLOFENAC SODIUM 1 % TD GEL: 4.0000 g | Freq: Four times a day (QID) | TRANSDERMAL | 6 refills | Status: AC | PRN
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
## 2017-12-18 ENCOUNTER — Telehealth (INDEPENDENT_AMBULATORY_CARE_PROVIDER_SITE_OTHER): Payer: Self-pay | Admitting: Family Medicine

## 2017-12-18 ENCOUNTER — Other Ambulatory Visit (INDEPENDENT_AMBULATORY_CARE_PROVIDER_SITE_OTHER): Payer: Self-pay | Admitting: Family Medicine

## 2017-12-18 DIAGNOSIS — M545 Low back pain, unspecified: Secondary | ICD-10-CM

## 2017-12-18 DIAGNOSIS — G8929 Other chronic pain: Secondary | ICD-10-CM

## 2017-12-18 NOTE — Telephone Encounter (Signed)
I'm pretty sure that any of the [facility] PT locations will be able to help.  If interested, she can go to the [facility] website and figure out which location would be most convenient, and I will request referral.

## 2017-12-18 NOTE — Telephone Encounter (Signed)
Left message on patient's vm - the PT facility may be calling her, or she can call them to schedule a time that is good for her.

## 2017-12-18 NOTE — Telephone Encounter (Signed)
Referral order placed.

## 2017-12-18 NOTE — Telephone Encounter (Signed)
New referral

## 2017-12-18 NOTE — Telephone Encounter (Signed)
Please advise 

## 2017-12-18 NOTE — Telephone Encounter (Signed)
Left this full message on the patient's voice mail. Will await a call back about which location to which she would like [place] to send the referral.
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
## 2017-12-18 NOTE — Telephone Encounter (Signed)
Patient left a message stating that she would try [facility] on [facility]. [phone]

## 2017-12-28 ENCOUNTER — Ambulatory Visit: Payer: No Typology Code available for payment source | Attending: Family Medicine | Admitting: Physical Therapy

## 2017-12-28 DIAGNOSIS — M545 Low back pain: Secondary | ICD-10-CM | POA: Insufficient documentation

## 2017-12-28 DIAGNOSIS — G8929 Other chronic pain: Secondary | ICD-10-CM | POA: Insufficient documentation

## 2017-12-28 NOTE — Therapy (Signed)
[place], [place], [address] Phone: [phone]   Fax:  [phone]  Physical Therapy Evaluation  Patient Details  Name: Renee Durham MRN: [id number] Date of Birth: [birth year] Referring Provider (PT): Eunice Blase MD   Encounter Date: 12/28/2017  PT End of Session - 12/28/17 1313    Visit Number  1    Number of Visits  13    Date for PT Re-Evaluation  02/08/18    PT Start Time  1320    Activity Tolerance  --    Behavior During Therapy  --       Past Medical History:  Diagnosis Date  . Anemia   . Arthritis    knees/back  . Chronic kidney disease   . H/O sickle cell trait   . Hypertension   . Obesities, morbid [facility])     Past Surgical History:  Procedure Laterality Date  . BIOPSY  08/08/2017   Procedure: BIOPSY;  Surgeon: Jerene Bears, MD;  Location: [facility];  Service: Gastroenterology;;  . BREAST CYST EXCISION    . COLONOSCOPY N/A 04/21/2014   Procedure: COLONOSCOPY;  Surgeon: Jerene Bears, MD;  Location: [facility];  Service: Gastroenterology;  Laterality: N/A;  . COLONOSCOPY WITH PROPOFOL N/A 08/08/2017   Procedure: COLONOSCOPY WITH PROPOFOL;  Surgeon: Jerene Bears, MD;  Location: [facility];  Service: Gastroenterology;  Laterality: N/A;  . EXTREMITY CYST EXCISION Right    right wrist  . HERNIA REPAIR     umbilical  . KNEE SURGERY     scope  . POLYPECTOMY    . POLYPECTOMY  08/08/2017   Procedure: POLYPECTOMY;  Surgeon: Jerene Bears, MD;  Location: [facility];  Service: Gastroenterology;;  . uterine ablation      There were no vitals filed for this visit.   Subjective Assessment - 12/28/17 1320    Currently in Pain?  Yes    Pain Location  Back    Pain Orientation  Right;Left         [facility] PT Assessment - 12/28/17 1307      Assessment   Medical Diagnosis  low back pain     Referring Provider (PT)  Eunice Blase MD      ROM / Strength   AROM / PROM /  Strength  AROM;Strength      AROM   AROM Assessment Site  Lumbar      Strength   Strength Assessment Site  Hip;Knee    Right/Left Hip  Right;Left    Right/Left Knee  Right;Left                Objective measurements completed on examination: See above findings.              PT Education - 12/28/17 1314    Education Details  evaluation findings, POC, goals, HEP with proper form     Person(s) Educated  Patient    Methods  Explanation;Verbal cues;Handout    Comprehension  Verbalized understanding;Verbal cues required                  Plan - 12/28/17 1334    Clinical Impression Statement  pt presents with Referral for her low back which she reports she is going to see a chiropractor and that her back is doing better. She stated the referral is supposed to be for her knee and reports she wanted to get an updated referral to get the knee taken care  and come back.        Patient will benefit from skilled therapeutic intervention in order to improve the following deficits and impairments:  Obesity, Pain, Decreased endurance, Decreased activity tolerance, Decreased strength, Increased fascial restricitons, Improper body mechanics, Postural dysfunction, Decreased safety awareness, Decreased range of motion, Increased muscle spasms  Visit Diagnosis: Chronic bilateral low back pain, unspecified whether sciatica present     Problem List Patient Active Problem List   Diagnosis Date Noted  . Hx of adenomatous colonic polyps   . Benign neoplasm of transverse colon   . Screening for colon cancer   . Benign neoplasm of ascending colon   . Benign neoplasm of rectosigmoid junction    Kristoffer Leamon PT, DPT, LAT, ATC  12/28/17  1:47 PM      [facility] [address] [place], [place], [address] Phone: [phone]   Fax:  [phone]  Name: Renee Durham MRN: [id number] Date of Birth:  [birth year]

## 2017-12-29 ENCOUNTER — Ambulatory Visit (INDEPENDENT_AMBULATORY_CARE_PROVIDER_SITE_OTHER): Payer: No Typology Code available for payment source | Admitting: Family Medicine

## 2018-01-01 DIAGNOSIS — I10 Essential (primary) hypertension: Secondary | ICD-10-CM | POA: Insufficient documentation

## 2018-01-01 DIAGNOSIS — Z6841 Body Mass Index (BMI) 40.0 and over, adult: Secondary | ICD-10-CM

## 2018-01-02 ENCOUNTER — Telehealth (INDEPENDENT_AMBULATORY_CARE_PROVIDER_SITE_OTHER): Payer: Self-pay | Admitting: Family Medicine

## 2018-01-02 ENCOUNTER — Other Ambulatory Visit (INDEPENDENT_AMBULATORY_CARE_PROVIDER_SITE_OTHER): Payer: Self-pay | Admitting: Family Medicine

## 2018-01-02 DIAGNOSIS — M25562 Pain in left knee: Secondary | ICD-10-CM

## 2018-01-02 DIAGNOSIS — G8929 Other chronic pain: Secondary | ICD-10-CM

## 2018-01-02 DIAGNOSIS — M545 Low back pain, unspecified: Secondary | ICD-10-CM

## 2018-01-02 NOTE — Telephone Encounter (Signed)
New order entered

## 2018-01-02 NOTE — Telephone Encounter (Signed)
It looks like the referral was just for the lower back.  New referral to include the left knee?  Please advise on this and whether she should come back on the 21st or wait until a little further out.

## 2018-01-02 NOTE — Telephone Encounter (Signed)
Pt called saying her referral to the rehabilitation center was suppose to be for her left knee and not her lower back. Pt is also wondering if she should keep her apt for the 21st ?  Pt # [phone]

## 2018-01-02 NOTE — Telephone Encounter (Signed)
Advised patient a new order was sent.  Cancelled her appointment on 01/04/18, so she has time to work on the knee in PT.  She said she will call us back to reschedule her followup for LBP and left knee pain.

## 2018-01-04 ENCOUNTER — Ambulatory Visit (INDEPENDENT_AMBULATORY_CARE_PROVIDER_SITE_OTHER): Payer: No Typology Code available for payment source | Admitting: Family Medicine

## 2018-01-16 ENCOUNTER — Other Ambulatory Visit: Payer: Self-pay

## 2018-01-16 ENCOUNTER — Ambulatory Visit: Payer: 59 | Attending: Family Medicine | Admitting: Physical Therapy

## 2018-01-16 DIAGNOSIS — M545 Low back pain, unspecified: Secondary | ICD-10-CM

## 2018-01-16 DIAGNOSIS — M25562 Pain in left knee: Secondary | ICD-10-CM | POA: Diagnosis present

## 2018-01-16 DIAGNOSIS — M6281 Muscle weakness (generalized): Secondary | ICD-10-CM | POA: Diagnosis present

## 2018-01-16 DIAGNOSIS — R262 Difficulty in walking, not elsewhere classified: Secondary | ICD-10-CM | POA: Diagnosis present

## 2018-01-16 DIAGNOSIS — R293 Abnormal posture: Secondary | ICD-10-CM | POA: Diagnosis present

## 2018-01-16 DIAGNOSIS — G8929 Other chronic pain: Secondary | ICD-10-CM

## 2018-01-16 NOTE — Patient Instructions (Signed)
Leg Extension (Hamstring)   Sit toward front edge of chair, with leg out straight, heel on floor, toes pointing toward body. Keeping back straight, bend forward at hip, breathing out through pursed lips. Return, breathing in. Repeat __3_ times. Repeat with other leg hold for 20-30 sec. Do _3__ sessions per day. Variation: Perform from standing position, with support.    Copyright  VHI. All rights reserved.  HIP: Flexion / KNEE: Extension, Straight Leg Raise   Raise leg, keeping knee straight. Perform slowly. _15__ reps per set, _2__ sets per day, _7__ days per week      Copyright  VHI. All rights reserved.     Raise leg until knee is straight. _15__ reps per set, __2_ sets per day, _1__ days per week    Copyright  VHI. All rights reserved.  Quad Set   Slowly tighten muscles on thigh of straight leg while counting out loud to __5__. Repeat with other leg. Repeat __50__ times. Watching TV Do __2-3__ sessions per day.  http://[email or web address].exer.us/361   Copyright  VHI. All rights reserved.  Voncille Lo, PT Certified Exercise Expert for the Aging Adult  01/16/18 9:27 AM Phone: [phone] Fax: [phone]

## 2018-01-16 NOTE — Therapy (Signed)
[place], [place], [address] Phone: [phone]   Fax:  [phone]  Physical Therapy Evaluation  Patient Details  Name: Renee Durham MRN: [id number] Date of Birth: 07-22-1955 Referring Provider (PT): Eunice Blase MD   Encounter Date: 01/16/2018  PT End of Session - 01/16/18 0922    Visit Number  1    Number of Visits  13    Date for PT Re-Evaluation  02/27/18    Authorization Type  [employer], [employer] benefit plan    PT Start Time  0900   came late to appt    PT Stop Time  0930    PT Time Calculation (min)  30 min    Activity Tolerance  Patient tolerated treatment well    Behavior During Therapy  [facility] for tasks assessed/performed       Past Medical History:  Diagnosis Date  . Anemia   . Arthritis    knees/back  . Chronic kidney disease   . H/O sickle cell trait   . Hypertension   . Obesities, morbid [facility])     Past Surgical History:  Procedure Laterality Date  . BIOPSY  08/08/2017   Procedure: BIOPSY;  Surgeon: Jerene Bears, MD;  Location: [facility];  Service: Gastroenterology;;  . BREAST CYST EXCISION    . COLONOSCOPY N/A 04/21/2014   Procedure: COLONOSCOPY;  Surgeon: Jerene Bears, MD;  Location: [facility];  Service: Gastroenterology;  Laterality: N/A;  . COLONOSCOPY WITH PROPOFOL N/A 08/08/2017   Procedure: COLONOSCOPY WITH PROPOFOL;  Surgeon: Jerene Bears, MD;  Location: [facility];  Service: Gastroenterology;  Laterality: N/A;  . EXTREMITY CYST EXCISION Right    right wrist  . HERNIA REPAIR     umbilical  . KNEE SURGERY     scope  . POLYPECTOMY    . POLYPECTOMY  08/08/2017   Procedure: POLYPECTOMY;  Surgeon: Jerene Bears, MD;  Location: [facility];  Service: Gastroenterology;;  . uterine ablation      There were no vitals filed for this visit.   Subjective Assessment - 01/16/18 0916    Subjective  I have had back pain for > 6 months but my left knee had been  hurting since October 2019. I use two canes    Pertinent History  sickle cell trait, HBP, right knee surgery cartilage,      How long can you sit comfortably?  2 hours    How long can you stand comfortably?  20-30 minutes    How long can you walk comfortably?  20-30 minutes    Diagnostic tests  x ray    Currently in Pain?  Yes    Pain Score  8     Pain Location  Back    Pain Orientation  Left;Right;Mid    Pain Type  Chronic pain    Pain Onset  More than a month ago    Pain Frequency  Intermittent    Aggravating Factors   a lot of standing. household chores,     Multiple Pain Sites  Yes    Pain Score  3    Pain Location  Knee    Pain Orientation  Left;Medial    Pain Descriptors / Indicators  Aching    Pain Type  Chronic pain    Pain Onset  More than a month ago    Pain Frequency  Intermittent    Aggravating Factors   standing a long time, cold weather  sit to stand         [facility] PT Assessment - 01/16/18 0919      Assessment   Medical Diagnosis  low back pain and left knee    Referring Provider (PT)  Hilts, Michael MD    Onset Date/Surgical Date  11/28/17    back is > 6 months but knee is about 3 months of pain   Hand Dominance  Right    Prior Therapy  for right knee but not left knee      Precautions   Precautions  None      Restrictions   Weight Bearing Restrictions  No      Balance Screen   Has the patient fallen in the past 6 months  No    Has the patient had a decrease in activity level because of a fear of falling?   No    Is the patient reluctant to leave their home because of a fear of falling?   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    Leisure  uses 2 canes      Cognition   Overall Cognitive Status  Within Functional Limits for tasks assessed      Observation/Other Assessments   Focus on Therapeutic Outcomes (FOTO)   FOTO intake 47% limitation 53% predicted 44%      Sensation   Light Touch  Appears Intact    Stereognosis  Appears Intact    Hot/Cold  Appears Intact    Proprioception  Appears Intact      Functional Tests   Functional tests  Squat;Sit to Stand      Squat   Comments  limted range, 60 degree hip flex      Sit to Stand   Comments  22.45 sec (normal < 14 sec)      Posture/Postural Control   Posture/Postural Control  Postural limitations    Postural Limitations  Anterior pelvic tilt;Rounded Shoulders;Forward head    Posture Comments  left QL elevated       ROM / Strength   AROM / PROM / Strength  AROM;Strength      AROM   AROM Assessment Site  Lumbar    Right Knee Extension  6    Right Knee Flexion  95    Left Knee Extension  7    Left Knee Flexion  80   pain in knee   Lumbar Flexion  50    Lumbar Extension  30   starts at 16   Lumbar - Right Side Bend  25    Lumbar - Left Side Bend  25    Lumbar - Right Rotation  25 % available range    Lumbar - Left Rotation  25% available range      Strength   Overall Strength  Deficits    Strength Assessment Site  Hip;Knee    Right/Left Hip  Right;Left    Right Hip Flexion  4+/5    Right Hip Extension  4/5    Right Hip External Rotation  4/5    Right Hip Internal Rotation  4/5    Right Hip ABduction  4-/5    Left Hip Flexion  4/5    Left Hip Extension  4-/5    Left Hip External Rotation  4-/5    Left Hip Internal Rotation  4/5    Left Hip ABduction  4-/5    Right Knee Flexion  4+/5    Right Knee Extension  4+/5    Left Knee Flexion  4/5    Left Knee Extension  4/5   pain on lateral hamstring     Palpation   Patella mobility  decreased patellar mobility on left sup / inferior    Palpation comment  LEft QL elevated and more  tender left lateral hamstring.  tight and lumbar paraspinals      Ambulation/Gait   Ambulation Surface  Level    Gait velocity  2.17 ft/sec    Gait Comments  pt with two canes and 4 point gait          There Ex  Sitting hamstring stretch,3 x 30 sec  SLR 1 x 10 left, LAQ 1 x 10 left Quad set 50 x 5 sec hold while watching TV  All exer 2-3 times sessions a day       Objective measurements completed on examination: See above findings.              PT Education - 01/16/18 0927    Education Details  POC, explanation of findings initial HEP for knee    Person(s) Educated  Patient    Methods  Explanation;Demonstration;Tactile cues;Verbal cues;Handout    Comprehension  Verbalized understanding;Returned demonstration       PT Short Term Goals - 01/16/18 1139      PT SHORT TERM GOAL #1   Title  STG=LTG        PT Long Term Goals - 01/16/18 0933      PT LONG TERM GOAL #1   Title  Pt will be independent with advanced HEP    Baseline  no knowledge    Time  6    Period  Weeks    Status  New    Target Date  02/27/18      PT LONG TERM GOAL #2   Title  "FOTO will improve from  53%limitation   to   44% limitation  indicating improved functional mobility.     Baseline  eval limiation 53%    Time  6    Period  Weeks    Status  New    Target Date  02/27/18      PT LONG TERM GOAL #3   Title  "Pt will be able to negotiate steps without exacerbation of pain greater than 2/10     Baseline  Pt has difficulty negotiating steps and uses two canes    Time  6    Period  Weeks    Status  New    Target Date  02/27/18      PT LONG TERM GOAL #4   Title  "Pt will improve her L knee flexion to  </= 115 degrees and extension to </= 5 degrees with </= 2/10 pain for a more functional and efficient gait pattern    Baseline  knee flexion less than 95 flexion    Time  6    Period  Weeks    Status  New    Target Date  02/27/18  PT LONG TERM GOAL #5   Title  "Pt will improve  LE  strength to >/= 4+/5 with </= 2/10 pain to promote safety with walking/standing activities and LRAD    Baseline  See flow chart  3-/5 to 4/5 eval    Time  6    Period  Weeks    Status  New    Target Date  02/27/18      PT LONG TERM GOAL #6   Title  Pt will perform 5 x STS in < 14 sec to show LE strength and reduction of fall risk    Baseline  eval 22.45 sec      PT LONG TERM GOAL #7   Title  Pt will be able to stand for 7min to 1 hour in order to complete household chores    Baseline  only able to stand 20-30 min    Time  6    Period  Weeks    Status  New    Target Date  02/27/18             Plan - 01/16/18 1257    Clinical Impression Statement  Pt presents today with mod complexity eval  with referral for low back and left knee pain.  Pt presents 15 minutes late to appt but evaluation done for her today.  Pt reports 8/10 pain in back and 3/10 pain in left knee with FOTO score limitaition of 53% in back.  Pt walks with 4 point gait and two cane and  2.17 ft/sec gait velocity .Marland Kitchen Pt is limited in her ability to stand greater than 20-30 minutes and this inhibits her ability to perform household chores  Pt with 22.45  sec 5 x STS which puts her at high risk of fall and indicative of LE weakness. Pt also has flexiblity issues with her knees that contribute to her pain.   Pt will benefit from skilled PT to address impairments in ROM, , strength flexibiilty, pain in order to return to safe and functional mobility    History and Personal Factors relevant to plan of care:  hx of sickle cell trait, ,obesity, right knee surgery chronic kidney disease, HTN     Clinical Presentation  Evolving    Clinical Decision Making  Moderate    Rehab Potential  Good    PT Frequency  2x / week    PT Duration  6 weeks    PT Treatment/Interventions  ADLs/Self Care Home Management;Cryotherapy;Electrical Stimulation;Iontophoresis 4mg /ml Dexamethasone;Moist Heat;Traction;Ultrasound;Neuromuscular  re-education;Therapeutic activities;Therapeutic exercise;Manual techniques;Passive range of motion;Patient/family education;Taping;Dry needling    PT Next Visit Plan  eval completed with knee and back this visit.  Review knee exericise and begin basic back    PT Home Exercise Plan  SLR, Quad set LAQ and     Consulted and Agree with Plan of Care  Patient       Patient will benefit from skilled therapeutic intervention in order to improve the following deficits and impairments:  Obesity, Pain, Decreased endurance, Decreased activity tolerance, Decreased strength, Increased fascial restricitons, Improper body mechanics, Postural dysfunction, Decreased safety awareness, Decreased range of motion, Increased muscle spasms, Impaired flexibility, Decreased mobility  Visit Diagnosis: Chronic bilateral low back pain, unspecified whether sciatica present  Chronic pain of left knee  Difficulty in walking, not elsewhere classified  Muscle weakness (generalized)  Abnormal posture     Problem List Patient Active Problem List   Diagnosis Date Noted  . Hx of adenomatous colonic polyps   .  Benign neoplasm of transverse colon   . Screening for colon cancer   . Benign neoplasm of ascending colon   . Benign neoplasm of rectosigmoid junction     Voncille Lo, PT Certified Exercise Expert for the Aging Adult  01/16/18 1:13 PM Phone: [phone] Fax: [facility] [address] [place], [place], [address] Phone: [phone]   Fax:  [phone]  Name: Renee Durham MRN: [id number] Date of Birth: Mar 20, 1955

## 2018-01-18 ENCOUNTER — Encounter: Payer: Self-pay | Admitting: Physical Therapy

## 2018-01-18 ENCOUNTER — Ambulatory Visit: Payer: 59 | Admitting: Physical Therapy

## 2018-01-18 DIAGNOSIS — M545 Low back pain: Principal | ICD-10-CM

## 2018-01-18 DIAGNOSIS — G8929 Other chronic pain: Secondary | ICD-10-CM

## 2018-01-18 DIAGNOSIS — R262 Difficulty in walking, not elsewhere classified: Secondary | ICD-10-CM

## 2018-01-18 DIAGNOSIS — M6281 Muscle weakness (generalized): Secondary | ICD-10-CM

## 2018-01-18 DIAGNOSIS — M25562 Pain in left knee: Secondary | ICD-10-CM

## 2018-01-18 DIAGNOSIS — R293 Abnormal posture: Secondary | ICD-10-CM

## 2018-01-18 NOTE — Therapy (Signed)
[place], [place], [address] Phone: [phone]   Fax:  [phone]  Physical Therapy Treatment  Patient Details  Name: Renee Durham MRN: [id number] Date of Birth: November 06, 1955 Referring Provider (PT): Eunice Blase MD   Encounter Date: 01/18/2018  PT End of Session - 01/18/18 1154    Visit Number  2    Number of Visits  13    Date for PT Re-Evaluation  02/27/18    Authorization Type  [employer], [employer] benefit plan    PT Start Time  1147    PT Stop Time  1225    PT Time Calculation (min)  38 min       Past Medical History:  Diagnosis Date  . Anemia   . Arthritis    knees/back  . Chronic kidney disease   . H/O sickle cell trait   . Hypertension   . Obesities, morbid [facility])     Past Surgical History:  Procedure Laterality Date  . BIOPSY  08/08/2017   Procedure: BIOPSY;  Surgeon: Jerene Bears, MD;  Location: [facility];  Service: Gastroenterology;;  . BREAST CYST EXCISION    . COLONOSCOPY N/A 04/21/2014   Procedure: COLONOSCOPY;  Surgeon: Jerene Bears, MD;  Location: [facility];  Service: Gastroenterology;  Laterality: N/A;  . COLONOSCOPY WITH PROPOFOL N/A 08/08/2017   Procedure: COLONOSCOPY WITH PROPOFOL;  Surgeon: Jerene Bears, MD;  Location: [facility];  Service: Gastroenterology;  Laterality: N/A;  . EXTREMITY CYST EXCISION Right    right wrist  . HERNIA REPAIR     umbilical  . KNEE SURGERY     scope  . POLYPECTOMY    . POLYPECTOMY  08/08/2017   Procedure: POLYPECTOMY;  Surgeon: Jerene Bears, MD;  Location: [facility];  Service: Gastroenterology;;  . uterine ablation      There were no vitals filed for this visit.  Subjective Assessment - 01/18/18 1154    Currently in Pain?  Yes    Pain Score  0-No pain    Pain Location  Back    Pain Score  4    Pain Location  Knee    Pain Orientation  Left;Medial    Pain Descriptors / Indicators  Aching                        [facility] Adult PT Treatment/Exercise - 01/18/18 0001      Exercises   Exercises  Knee/Hip;Lumbar      Lumbar Exercises: Stretches   Single Knee to Chest Stretch  5 reps;10 seconds    Single Knee to Chest Stretch Limitations  with towel     Lower Trunk Rotation  5 reps;10 seconds    Lower Trunk Rotation Limitations  each way     Other Lumbar Stretch Exercise  Seated physioball roll outs for lumbar flexion and side bend 5 x 10 sec each       Lumbar Exercises: Supine   Pelvic Tilt  15 reps    Bridge Limitations  unable, pain/spasm      Knee/Hip Exercises: Stretches   Active Hamstring Stretch  3 reps;10 seconds;Both      Knee/Hip Exercises: Seated   Long Arc Quad  2 sets;5 reps      Knee/Hip Exercises: Supine   Quad Sets  1 set;10 reps;Right;Left    Straight Leg Raises  10 reps    Straight Leg Raises Limitations  with adominal draw  in               PT Short Term Goals - 01/16/18 1139      PT SHORT TERM GOAL #1   Title  STG=LTG        PT Long Term Goals - 01/16/18 0933      PT LONG TERM GOAL #1   Title  Pt will be independent with advanced HEP    Baseline  no knowledge    Time  6    Period  Weeks    Status  New    Target Date  02/27/18      PT LONG TERM GOAL #2   Title  "FOTO will improve from  53%limitation   to   44% limitation  indicating improved functional mobility.     Baseline  eval limiation 53%    Time  6    Period  Weeks    Status  New    Target Date  02/27/18      PT LONG TERM GOAL #3   Title  "Pt will be able to negotiate steps without exacerbation of pain greater than 2/10     Baseline  Pt has difficulty negotiating steps and uses two canes    Time  6    Period  Weeks    Status  New    Target Date  02/27/18      PT LONG TERM GOAL #4   Title  "Pt will improve her L knee flexion to  </= 115 degrees and extension to </= 5 degrees with </= 2/10 pain for a more functional and efficient gait pattern     Baseline  knee flexion less than 95 flexion    Time  6    Period  Weeks    Status  New    Target Date  02/27/18      PT LONG TERM GOAL #5   Title  "Pt will improve LE  strength to >/= 4+/5 with </= 2/10 pain to promote safety with walking/standing activities and LRAD    Baseline  See flow chart  3-/5 to 4/5 eval    Time  6    Period  Weeks    Status  New    Target Date  02/27/18      PT LONG TERM GOAL #6   Title  Pt will perform 5 x STS in < 14 sec to show LE strength and reduction of fall risk    Baseline  eval 22.45 sec      PT LONG TERM GOAL #7   Title  Pt will be able to stand for 5min to 1 hour in order to complete household chores    Baseline  only able to stand 20-30 min    Time  6    Period  Weeks    Status  New    Target Date  02/27/18            Plan - 01/18/18 1301    Clinical Impression Statement  Pt reports compliance with HEP. Added [place] Back exercises today. She felt better after physioball roll outs in sitting. SHe had increased pain with bridge. Able to add trunk rotation and pelvic tilits without increased pain.     PT Next Visit Plan  Review knee exericise and continue basic back    PT Home Exercise Plan  SLR, Quad set LAQ seated hamstring, LTR, PPT, physio/chair rol outs for lumbar flexion    Consulted  and Agree with Plan of Care  Patient       Patient will benefit from skilled therapeutic intervention in order to improve the following deficits and impairments:  Obesity, Pain, Decreased endurance, Decreased activity tolerance, Decreased strength, Increased fascial restricitons, Improper body mechanics, Postural dysfunction, Decreased safety awareness, Decreased range of motion, Increased muscle spasms, Impaired flexibility, Decreased mobility  Visit Diagnosis: Chronic bilateral low back pain, unspecified whether sciatica present  Chronic pain of left knee  Difficulty in walking, not elsewhere classified  Muscle weakness  (generalized)  Abnormal posture     Problem List Patient Active Problem List   Diagnosis Date Noted  . Hx of adenomatous colonic polyps   . Benign neoplasm of transverse colon   . Screening for colon cancer   . Benign neoplasm of ascending colon   . Benign neoplasm of rectosigmoid junction     Dorene Ar, PTA 01/18/2018, 1:03 PM  [facility] [address] [place], [place], [address] Phone: [phone]   Fax:  [phone]  Name: Renee Durham MRN: [id number] Date of Birth: 11/23/1955

## 2018-01-24 ENCOUNTER — Ambulatory Visit: Payer: 59 | Admitting: Physical Therapy

## 2018-01-24 ENCOUNTER — Encounter: Payer: Self-pay | Admitting: Physical Therapy

## 2018-01-24 DIAGNOSIS — R262 Difficulty in walking, not elsewhere classified: Secondary | ICD-10-CM

## 2018-01-24 DIAGNOSIS — M6281 Muscle weakness (generalized): Secondary | ICD-10-CM

## 2018-01-24 DIAGNOSIS — R293 Abnormal posture: Secondary | ICD-10-CM

## 2018-01-24 DIAGNOSIS — M545 Low back pain, unspecified: Secondary | ICD-10-CM

## 2018-01-24 DIAGNOSIS — G8929 Other chronic pain: Secondary | ICD-10-CM

## 2018-01-24 DIAGNOSIS — M25562 Pain in left knee: Secondary | ICD-10-CM

## 2018-01-24 NOTE — Therapy (Signed)
[place], [place], [address] Phone: [phone]   Fax:  [phone]  Physical Therapy Treatment  Patient Details  Name: Renee Durham MRN: [id number] Date of Birth: 27-May-1955 Referring Provider (PT): Eunice Blase MD   Encounter Date: 01/24/2018  PT End of Session - 01/24/18 1032    Visit Number  3    Number of Visits  13    Date for PT Re-Evaluation  02/27/18    PT Start Time  9833    PT Stop Time  1113    PT Time Calculation (min)  41 min    Activity Tolerance  Patient tolerated treatment well    Behavior During Therapy  [facility] for tasks assessed/performed       Past Medical History:  Diagnosis Date  . Anemia   . Arthritis    knees/back  . Chronic kidney disease   . H/O sickle cell trait   . Hypertension   . Obesities, morbid [facility])     Past Surgical History:  Procedure Laterality Date  . BIOPSY  08/08/2017   Procedure: BIOPSY;  Surgeon: Jerene Bears, MD;  Location: [facility];  Service: Gastroenterology;;  . BREAST CYST EXCISION    . COLONOSCOPY N/A 04/21/2014   Procedure: COLONOSCOPY;  Surgeon: Jerene Bears, MD;  Location: [facility];  Service: Gastroenterology;  Laterality: N/A;  . COLONOSCOPY WITH PROPOFOL N/A 08/08/2017   Procedure: COLONOSCOPY WITH PROPOFOL;  Surgeon: Jerene Bears, MD;  Location: [facility];  Service: Gastroenterology;  Laterality: N/A;  . EXTREMITY CYST EXCISION Right    right wrist  . HERNIA REPAIR     umbilical  . KNEE SURGERY     scope  . POLYPECTOMY    . POLYPECTOMY  08/08/2017   Procedure: POLYPECTOMY;  Surgeon: Jerene Bears, MD;  Location: [facility];  Service: Gastroenterology;;  . uterine ablation      There were no vitals filed for this visit.  Subjective Assessment - 01/24/18 1033    Subjective  "I am doing alright today since the last visit, my back is still doing pretty good, by L knee"    Currently in Pain?  Yes    Pain Score  0-No  pain    Pain Score  3    Pain Orientation  Medial    Pain Descriptors / Indicators  --   stiffness   Pain Type  Chronic pain    Pain Onset  More than a month ago    Pain Frequency  Intermittent    Aggravating Factors   standing a long time                       [facility] Adult PT Treatment/Exercise - 01/24/18 0001      Exercises   Exercises  Knee/Hip;Lumbar      Lumbar Exercises: Seated   Other Seated Lumbar Exercises  horizontal lift/ chop 2 x 10 bil with red theraband      Knee/Hip Exercises: Stretches   Active Hamstring Stretch  2 reps;30 seconds      Knee/Hip Exercises: Seated   Long Arc Quad  2 sets;10 reps;Weights   with ball squeeze for VMO facilitation   Long Arc Quad Weight  2 lbs.    Other Seated Knee/Hip Exercises  hip abduction 2 x 12 with red theraband    Marching  Both;Strengthening;2 sets;20 reps   alternating   Marching Weights  2 lbs.  Manual Therapy   Manual Therapy  Soft tissue mobilization    Manual therapy comments  MTPR along the vastus lateralis on the L             PT Education - 01/24/18 1102    Education Details  updated HEP for hip/ knee strengthening    Person(s) Educated  Patient    Methods  Explanation;Verbal cues    Comprehension  Verbalized understanding;Verbal cues required       PT Short Term Goals - 01/16/18 1139      PT SHORT TERM GOAL #1   Title  STG=LTG        PT Long Term Goals - 01/16/18 0933      PT LONG TERM GOAL #1   Title  Pt will be independent with advanced HEP    Baseline  no knowledge    Time  6    Period  Weeks    Status  New    Target Date  02/27/18      PT LONG TERM GOAL #2   Title  "FOTO will improve from  53%limitation   to   44% limitation  indicating improved functional mobility.     Baseline  eval limiation 53%    Time  6    Period  Weeks    Status  New    Target Date  02/27/18      PT LONG TERM GOAL #3   Title  "Pt will be able to negotiate steps without exacerbation  of pain greater than 2/10     Baseline  Pt has difficulty negotiating steps and uses two canes    Time  6    Period  Weeks    Status  New    Target Date  02/27/18      PT LONG TERM GOAL #4   Title  "Pt will improve her L knee flexion to  </= 115 degrees and extension to </= 5 degrees with </= 2/10 pain for a more functional and efficient gait pattern    Baseline  knee flexion less than 95 flexion    Time  6    Period  Weeks    Status  New    Target Date  02/27/18      PT LONG TERM GOAL #5   Title  "Pt will improve LE  strength to >/= 4+/5 with </= 2/10 pain to promote safety with walking/standing activities and LRAD    Baseline  See flow chart  3-/5 to 4/5 eval    Time  6    Period  Weeks    Status  New    Target Date  02/27/18      PT LONG TERM GOAL #6   Title  Pt will perform 5 x STS in < 14 sec to show LE strength and reduction of fall risk    Baseline  eval 22.45 sec      PT LONG TERM GOAL #7   Title  Pt will be able to stand for 47min to 1 hour in order to complete household chores    Baseline  only able to stand 20-30 min    Time  6    Period  Weeks    Status  New    Target Date  02/27/18            Plan - 01/24/18 1102    Clinical Impression Statement  pt reported no pain inthe back but conitnued stiffness/ soreness  in the L knee. Focused primarly on the L knee with hip/ quad strengthening following STW to release vastus lateralis. updated HEP for seated hip strengtheing with provided red band. she reported decreased stiffness in the knee at the end of the session.     PT Treatment/Interventions  ADLs/Self Care Home Management;Cryotherapy;Electrical Stimulation;Iontophoresis 4mg /ml Dexamethasone;Moist Heat;Traction;Ultrasound;Neuromuscular re-education;Therapeutic activities;Therapeutic exercise;Manual techniques;Passive range of motion;Patient/family education;Taping;Dry needling    PT Next Visit Plan  Review knee exericise and continue basic back    PT Home  Exercise Plan  SLR, Quad set LAQ seated hamstring, LTR, PPT, physio/chair rol outs for lumbar flexion    Consulted and Agree with Plan of Care  Patient       Patient will benefit from skilled therapeutic intervention in order to improve the following deficits and impairments:  Obesity, Pain, Decreased endurance, Decreased activity tolerance, Decreased strength, Increased fascial restricitons, Improper body mechanics, Postural dysfunction, Decreased safety awareness, Decreased range of motion, Increased muscle spasms, Impaired flexibility, Decreased mobility  Visit Diagnosis: Chronic bilateral low back pain, unspecified whether sciatica present  Chronic pain of left knee  Difficulty in walking, not elsewhere classified  Muscle weakness (generalized)  Abnormal posture     Problem List Patient Active Problem List   Diagnosis Date Noted  . Hx of adenomatous colonic polyps   . Benign neoplasm of transverse colon   . Screening for colon cancer   . Benign neoplasm of ascending colon   . Benign neoplasm of rectosigmoid junction    Harden Bramer PT, DPT, LAT, ATC  01/24/18  11:14 AM      [facility] [address] [place], [place], [address] Phone: [phone]   Fax:  [phone]  Name: Renee Durham MRN: [id number] Date of Birth: [birth year]

## 2018-01-26 ENCOUNTER — Other Ambulatory Visit (INDEPENDENT_AMBULATORY_CARE_PROVIDER_SITE_OTHER): Payer: Self-pay | Admitting: Family Medicine

## 2018-01-31 ENCOUNTER — Encounter: Payer: Self-pay | Admitting: Physical Therapy

## 2018-01-31 ENCOUNTER — Ambulatory Visit: Payer: 59 | Admitting: Physical Therapy

## 2018-01-31 DIAGNOSIS — R293 Abnormal posture: Secondary | ICD-10-CM

## 2018-01-31 DIAGNOSIS — M545 Low back pain, unspecified: Secondary | ICD-10-CM

## 2018-01-31 DIAGNOSIS — M25562 Pain in left knee: Secondary | ICD-10-CM

## 2018-01-31 DIAGNOSIS — R262 Difficulty in walking, not elsewhere classified: Secondary | ICD-10-CM

## 2018-01-31 DIAGNOSIS — M6281 Muscle weakness (generalized): Secondary | ICD-10-CM

## 2018-01-31 DIAGNOSIS — G8929 Other chronic pain: Secondary | ICD-10-CM

## 2018-01-31 NOTE — Therapy (Signed)
[place], [place], [address] Phone: [phone]   Fax:  [phone]  Physical Therapy Treatment  Patient Details  Name: Renee Durham MRN: [id number] Date of Birth: 10-Aug-1955 Referring Provider (PT): Eunice Blase MD   Encounter Date: 01/31/2018  PT End of Session - 01/31/18 1014    Visit Number  4    Number of Visits  13    Date for PT Re-Evaluation  02/27/18    Authorization Type  [employer], [employer] benefit plan    PT Start Time  [phone]    PT Stop Time  1013    PT Time Calculation (min)  41 min    Activity Tolerance  Patient tolerated treatment well    Behavior During Therapy  [facility] for tasks assessed/performed       Past Medical History:  Diagnosis Date  . Anemia   . Arthritis    knees/back  . Chronic kidney disease   . H/O sickle cell trait   . Hypertension   . Obesities, morbid [facility])     Past Surgical History:  Procedure Laterality Date  . BIOPSY  08/08/2017   Procedure: BIOPSY;  Surgeon: Jerene Bears, MD;  Location: [facility];  Service: Gastroenterology;;  . BREAST CYST EXCISION    . COLONOSCOPY N/A 04/21/2014   Procedure: COLONOSCOPY;  Surgeon: Jerene Bears, MD;  Location: [facility];  Service: Gastroenterology;  Laterality: N/A;  . COLONOSCOPY WITH PROPOFOL N/A 08/08/2017   Procedure: COLONOSCOPY WITH PROPOFOL;  Surgeon: Jerene Bears, MD;  Location: [facility];  Service: Gastroenterology;  Laterality: N/A;  . EXTREMITY CYST EXCISION Right    right wrist  . HERNIA REPAIR     umbilical  . KNEE SURGERY     scope  . POLYPECTOMY    . POLYPECTOMY  08/08/2017   Procedure: POLYPECTOMY;  Surgeon: Jerene Bears, MD;  Location: [facility];  Service: Gastroenterology;;  . uterine ablation      There were no vitals filed for this visit.  Subjective Assessment - 01/31/18 0935    Subjective  I have a bladder infection.  I am going to start antiobiotics.  I need 2 canes to  walk.  i am getting a mobility test for a hover around.  Sometimes i need it in store.  i can't  wait an hour everytime to wait for a cart.  Ilkied the pressure points  they helped.      Currently in Pain?  Yes    Pain Score  0-No pain   up to 6-7/10   Pain Location  Back    Pain Orientation  Right;Left;Mid;Lower    Pain Descriptors / Indicators  Aching    Pain Type  Chronic pain    Pain Frequency  Intermittent    Aggravating Factors   standing ,  longer time on feet.     Pain Relieving Factors  sitting    Effect of Pain on Daily Activities  Communit walking challanging     Multiple Pain Sites  Yes    Pain Score  0    Pain Location  Knee    Pain Orientation  Left   right knee pressure with walking rubbing.   long standing     Pain Descriptors / Indicators  --   stiff.  not really bothering me today.  feels like it is going to  come out of socket.    Pain Type  Chronic pain  Pain Onset  More than a month ago    Pain Frequency  Intermittent    Aggravating Factors   standing a long time    Pain Relieving Factors  sitting.                         [facility] Adult PT Treatment/Exercise - 01/31/18 0001      Exercises   Exercises  Knee/Hip;Lumbar      Lumbar Exercises: Stretches   Active Hamstring Stretch  Right;Left;3 reps;30 seconds    Active Hamstring Stretch Limitations  sitting    Other Lumbar Stretch Exercise  seated lumbar stretches 3 X sliding hands down legs sitting.      Lumbar Exercises: Seated   Long Arc Quad on Chair  AROM;10 reps   15 to 20 seconds,  minor cues.    Hip Flexion on Ball Limitations  Hip flexion on chair Vs ball cued initially    Other Seated Lumbar Exercises  isometric abdominals both and single hands  HEP      Knee/Hip Exercises: Seated   Long Arc Quad  Strengthening      Manual Therapy   Manual therapy comments  MTPR along the vastus lateralis on the L   taught patient how to do            PT Education - 01/31/18 1008     Education Details  HEP ,  manual how to     [employer]) Educated  Patient    Methods  Explanation;Demonstration;Tactile cues;Verbal cues;Handout    Comprehension  Returned demonstration;Verbalized understanding       PT Short Term Goals - 01/16/18 1139      PT SHORT TERM GOAL #1   Title  STG=LTG        PT Long Term Goals - 01/31/18 1015      PT LONG TERM GOAL #1   Title  Pt will be independent with advanced HEP    Baseline  minor cuies    Time  6    Period  Weeks    Status  On-going      PT LONG TERM GOAL #2   Title  "FOTO will improve from  53%limitation   to   44% limitation  indicating improved functional mobility.     Time  6    Period  Weeks    Status  Unable to assess      PT LONG TERM GOAL #3   Title  "Pt will be able to negotiate steps without exacerbation of pain greater than 2/10     Time  6    Period  Weeks    Status  Unable to assess      PT LONG TERM GOAL #4   Time  6    Status  Unable to assess      PT LONG TERM GOAL #5   Title  "Pt will improve LE  strength to >/= 4+/5 with </= 2/10 pain to promote safety with walking/standing activities and LRAD    Baseline  address with exercise    Time  6    Period  Weeks    Status  On-going      PT LONG TERM GOAL #6   Title  Pt will perform 5 x STS in < 14 sec to show LE strength and reduction of fall risk    Period  Weeks    Status  Unable to assess  PT LONG TERM GOAL #7   Title  Pt will be able to stand for 25min to 1 hour in order to complete household chores    Baseline  only able to stand 20-30 min    Time  6    Period  Weeks    Status  On-going            Plan - 01/31/18 1014    Clinical Impression Statement  Exercise review  today. She likes to exercise sitting.  Progress toward HEP goal.  Pain decreased today.     PT Next Visit Plan  Review knee exericise and continue basic back    PT Home Exercise Plan  SLR, Quad set LAQ seated hamstring, LTR, PPT, physio/chair rol outs for lumbar  flexion.  Sitting abdominal isometrics     Consulted and Agree with Plan of Care  Patient       Patient will benefit from skilled therapeutic intervention in order to improve the following deficits and impairments:     Visit Diagnosis: Chronic bilateral low back pain, unspecified whether sciatica present  Chronic pain of left knee  Difficulty in walking, not elsewhere classified  Muscle weakness (generalized)  Abnormal posture     Problem List Patient Active Problem List   Diagnosis Date Noted  . Hx of adenomatous colonic polyps   . Benign neoplasm of transverse colon   . Screening for colon cancer   . Benign neoplasm of ascending colon   . Benign neoplasm of rectosigmoid junction     Guilianna Mckoy PTA 01/31/2018, 10:17 AM  [place], [place], [address] Phone: [phone]   Fax:  [phone]  Name: Renee Durham MRN: [id number] Date of Birth: 1955/06/09

## 2018-01-31 NOTE — Patient Instructions (Signed)
Isometric Abdominals.  Sit with good posture on the edge of the couch  Press both hands into legs as you breath out  10 X 1 breath each  Press each single hand to knee  10 x as above.

## 2018-02-12 ENCOUNTER — Ambulatory Visit: Payer: 59 | Admitting: Physical Therapy

## 2018-02-12 ENCOUNTER — Encounter: Payer: Self-pay | Admitting: Physical Therapy

## 2018-02-12 DIAGNOSIS — M545 Low back pain, unspecified: Secondary | ICD-10-CM

## 2018-02-12 DIAGNOSIS — R262 Difficulty in walking, not elsewhere classified: Secondary | ICD-10-CM

## 2018-02-12 DIAGNOSIS — R293 Abnormal posture: Secondary | ICD-10-CM

## 2018-02-12 DIAGNOSIS — M6281 Muscle weakness (generalized): Secondary | ICD-10-CM

## 2018-02-12 DIAGNOSIS — M25562 Pain in left knee: Secondary | ICD-10-CM

## 2018-02-12 DIAGNOSIS — G8929 Other chronic pain: Secondary | ICD-10-CM

## 2018-02-12 NOTE — Therapy (Signed)
[place], [place], [address] Phone: [phone]   Fax:  [phone]  Physical Therapy Treatment  Patient Details  Name: Renee Durham MRN: [id number] Date of Birth: [birth year] Referring Provider (PT): Eunice Blase MD   Encounter Date: 02/12/2018  PT End of Session - 02/12/18 1506    Visit Number  5    Number of Visits  13    Date for PT Re-Evaluation  02/27/18    Authorization Type  [employer], [employer] benefit plan    PT Start Time  1428   short session due to patient arrived late   PT Stop Time  1500    PT Time Calculation (min)  32 min    Activity Tolerance  Patient tolerated treatment well;No increased pain       Past Medical History:  Diagnosis Date  . Anemia   . Arthritis    knees/back  . Chronic kidney disease   . H/O sickle cell trait   . Hypertension   . Obesities, morbid [facility])     Past Surgical History:  Procedure Laterality Date  . BIOPSY  08/08/2017   Procedure: BIOPSY;  Surgeon: Jerene Bears, MD;  Location: [facility];  Service: Gastroenterology;;  . BREAST CYST EXCISION    . COLONOSCOPY N/A 04/21/2014   Procedure: COLONOSCOPY;  Surgeon: Jerene Bears, MD;  Location: [facility];  Service: Gastroenterology;  Laterality: N/A;  . COLONOSCOPY WITH PROPOFOL N/A 08/08/2017   Procedure: COLONOSCOPY WITH PROPOFOL;  Surgeon: Jerene Bears, MD;  Location: [facility];  Service: Gastroenterology;  Laterality: N/A;  . EXTREMITY CYST EXCISION Right    right wrist  . HERNIA REPAIR     umbilical  . KNEE SURGERY     scope  . POLYPECTOMY    . POLYPECTOMY  08/08/2017   Procedure: POLYPECTOMY;  Surgeon: Jerene Bears, MD;  Location: [facility];  Service: Gastroenterology;;  . uterine ablation      There were no vitals filed for this visit.  Subjective Assessment - 02/12/18 1434    Subjective   Less stiff  now .  has been doing the HEP.    wrists are tendre LT>  right.      Currently in Pain?  No/denies    Pain Location  Back    Pain Type  Chronic pain    Pain Frequency  Intermittent    Aggravating Factors   longer time on feet,  longer standing    Pain Relieving Factors  sitting,  milder weather,  manual    Effect of Pain on Daily Activities  needs 2 canes,  community walking challanging    Pain Score  0    Pain Location  Knee    Pain Orientation  Left    Pain Descriptors / Indicators  --   stiff   Pain Onset  More than a month ago    Pain Frequency  Intermittent    Aggravating Factors   longer standing    Pain Relieving Factors  sitting  milder   exercise  manual                       [facility] Adult PT Treatment/Exercise - 02/12/18 0001      Lumbar Exercises: Standing   Heel Raises  10 reps   2 sets     Lumbar Exercises: Seated   Other Seated Lumbar Exercises  isometric abdominals both and single hands  HEP      Lumbar Exercises: Prone   Other Prone Lumbar Exercises  leaning over tall mat 10 x 2 sets each hip extension.  cued initially.      Knee/Hip Exercises: Seated   Long Arc [employer]  Strengthening;Both;2 sets    [employer] Limitations  ! set with ball   squeeze.      Other Seated Knee/Hip Exercises  clams  2 X 10 green bands.    Marching  Both;2 sets    Marching Limitations  1 set with opposite arm reach.              PT Education - 02/12/18 1505    Education Details  Exercise how to     [employer]) Educated  Patient    Methods  Explanation;Demonstration;Verbal cues;Tactile cues    Comprehension  Returned demonstration;Verbalized understanding       PT Short Term Goals - 01/16/18 1139      PT SHORT TERM GOAL #1   Title  STG=LTG        PT Long Term Goals - 02/12/18 1508      PT LONG TERM GOAL #1   Title  Pt will be independent with advanced HEP    Baseline  compliant so far independent    Time  6    Period  Weeks    Status  On-going      PT LONG TERM GOAL #2   Title  "FOTO will improve from   53%limitation   to   44% limitation  indicating improved functional mobility.     Time  6    Period  Weeks    Status  Unable to assess      PT LONG TERM GOAL #3   Title  "Pt will be able to negotiate steps without exacerbation of pain greater than 2/10     Baseline  pain 5/10 on steps    Time  6    Period  Weeks    Status  On-going      PT LONG TERM GOAL #4   Title  "Pt will improve her L knee flexion to  </= 115 degrees and extension to </= 5 degrees with </= 2/10 pain for a more functional and efficient gait pattern    Time  6    Period  Weeks    Status  Unable to assess      PT LONG TERM GOAL #5   Title  "Pt will improve LE  strength to >/= 4+/5 with </= 2/10 pain to promote safety with walking/standing activities and LRAD    Baseline  address with exercise    Time  6    Period  Weeks    Status  On-going      PT LONG TERM GOAL #6   Title  Pt will perform 5 x STS in < 14 sec to show LE strength and reduction of fall risk    Period  Weeks    Status  Unable to assess      PT LONG TERM GOAL #7   Title  Pt will be able to stand for 53min to 1 hour in order to complete household chores    Time  6    Period  Weeks    Status  Unable to assess            Plan - 02/12/18 1506    Clinical Impression Statement  Gait with 2 canes require less effort  today.  Fair weather may be resopnsible.  Steps at a wedding were navigated up with up to 5/10 pain.  Less pain descending.   Strength focus today .  No increased pain at end of session.  Fatigue reported.      PT Next Visit Plan  Basic back,  strengthening hips knees    PT Home Exercise Plan  SLR, Quad set LAQ seated hamstring, LTR, PPT, physio/chair rol outs for lumbar flexion.  Sitting abdominal isometrics        Patient will benefit from skilled therapeutic intervention in order to improve the following deficits and impairments:     Visit Diagnosis: Chronic bilateral low back pain, unspecified whether sciatica  present  Chronic pain of left knee  Difficulty in walking, not elsewhere classified  Muscle weakness (generalized)  Abnormal posture     Problem List Patient Active Problem List   Diagnosis Date Noted  . Hx of adenomatous colonic polyps   . Benign neoplasm of transverse colon   . Screening for colon cancer   . Benign neoplasm of ascending colon   . Benign neoplasm of rectosigmoid junction     HARRIS,KAREN PTA 02/12/2018, 3:10 PM  [facility] [address] [place], [place], [address] Phone: [phone]   Fax:  [phone]  Name: Safire MARYA LOWDEN MRN: [id number] Date of Birth: 1955/03/13

## 2018-02-15 ENCOUNTER — Ambulatory Visit: Payer: 59 | Attending: Family Medicine | Admitting: Physical Therapy

## 2018-02-15 ENCOUNTER — Encounter: Payer: Self-pay | Admitting: Physical Therapy

## 2018-02-15 DIAGNOSIS — M25562 Pain in left knee: Secondary | ICD-10-CM | POA: Diagnosis present

## 2018-02-15 DIAGNOSIS — G8929 Other chronic pain: Secondary | ICD-10-CM

## 2018-02-15 DIAGNOSIS — M6281 Muscle weakness (generalized): Secondary | ICD-10-CM

## 2018-02-15 DIAGNOSIS — M545 Low back pain: Secondary | ICD-10-CM | POA: Diagnosis present

## 2018-02-15 DIAGNOSIS — R293 Abnormal posture: Secondary | ICD-10-CM

## 2018-02-15 DIAGNOSIS — R262 Difficulty in walking, not elsewhere classified: Secondary | ICD-10-CM

## 2018-02-15 NOTE — Therapy (Signed)
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 Anemia   . Arthritis    knees/back  . Chronic kidney disease   . H/O sickle cell trait   . Hypertension   . Obesities, morbid [facility])     Past Surgical History:  Procedure Laterality Date  . BIOPSY  08/08/2017   Procedure: BIOPSY;  Surgeon: Jerene Bears, MD;  Location: [facility];  Service: Gastroenterology;;  . BREAST CYST EXCISION    . COLONOSCOPY N/A 04/21/2014   Procedure: COLONOSCOPY;  Surgeon: Jerene Bears, MD;  Location: [facility];  Service: Gastroenterology;  Laterality: N/A;  . COLONOSCOPY WITH PROPOFOL N/A 08/08/2017   Procedure: COLONOSCOPY WITH PROPOFOL;  Surgeon: Jerene Bears, MD;  Location: [facility];  Service: Gastroenterology;  Laterality: N/A;  . EXTREMITY CYST EXCISION Right    right wrist  . HERNIA REPAIR     umbilical  . KNEE SURGERY     scope  . POLYPECTOMY    . POLYPECTOMY  08/08/2017   Procedure: POLYPECTOMY;  Surgeon: Jerene Bears, MD;  Location: [facility];  Service: Gastroenterology;;  . uterine ablation      There were no vitals filed for this visit.  Subjective Assessment - 02/15/18 1518    Subjective  Exercised  at the [facility] today.  I pickup the water  exercise schedule.    I was weaker on the steps.  it did not hurt the back so bad.   kenne are less stiff.     Currently in Pain?  No/denies    Pain Score  --   mild   Pain Location  Back    Aggravating Factors   longer walking.                        [place] Adult PT Treatment/Exercise - 02/15/18 0001      Lumbar Exercises: Stretches   Other Lumbar Stretch Exercise  seated lumbar stretch with rolling ball 3 way,  feels good.       Lumbar Exercises: Standing   Heel Raises  10 reps    Functional Squats Limitations  unable to do .  unable to stand and unlockk knees from extension      Lumbar Exercises: Seated   Long Arc Quad on Chair  10 reps    Hip Flexion on Ball Limitations  on chair X 10 alternqting and 10 x with alternate arm reach  0 and 2 LBS    Other Seated Lumbar Exercises  ball rolling for low back and hip stretch.    Other Seated Lumbar Exercises  isometric abdominals both and single hands  HEP   pressing green ball,  cued breathing.       Knee/Hip Exercises: Seated   Long Arc Quad  Strengthening;10 reps;2 sets   1 set 0 LBS  no pain withese today.     Long Arc Quad Weight  2 lbs.    Marching  --    Marching Limitations  --             PT Education - 02/15/18 1635    Education Details  exercise form    Person(s) Educated  Patient    Methods  Explanation;Demonstration;Verbal cues    Comprehension  Verbalized understanding;Returned demonstration       PT Short Term Goals - 01/16/18 1139      PT SHORT TERM GOAL #1   Title  STG=LTG        PT Long Term Goals - 02/12/18 1508      PT LONG TERM GOAL #1   Title  Pt will be independent with advanced HEP    Baseline  compliant so far independent    Time  6    Period  Weeks    Status  On-going      PT LONG TERM GOAL #2   Title  "FOTO will improve from  53%limitation   to   44% limitation  indicating improved functional mobility.     Time  6    Period  Weeks    Status  Unable to assess       PT LONG TERM GOAL #3   Title  "Pt will be able to negotiate steps without exacerbation of pain greater than 2/10     Baseline  pain 5/10 on steps    Time  6    Period  Weeks    Status  On-going      PT LONG TERM GOAL #4   Title  "Pt will improve her L knee flexion to  </= 115 degrees and extension to </= 5 degrees with </= 2/10 pain for a more functional and efficient gait pattern    Time  6    Period  Weeks    Status  Unable to assess      PT LONG TERM GOAL #5   Title  "Pt will improve LE  strength to >/= 4+/5 with </= 2/10 pain to promote safety with walking/standing activities and LRAD    Baseline  address with exercise    Time  6    Period  Weeks    Status  On-going      PT LONG TERM GOAL #6   Title  Pt will perform 5 x STS in < 14 sec to show LE strength and reduction of fall risk    Period  Weeks    Status  Unable to assess      PT LONG TERM GOAL #7   Title  Pt will be able to stand for 2min to 1 hour in order to complete household chores    Time  6    Period  Weeks    Status  Unable to assess            Plan - 02/15/18 1636    Clinical Impression Statement  patient is motivated today and worked hard with her exercises.  she did her exercises at the [facility]  She got the schedule for water exercise.  Short session due to patient late.  She was  unable to do mini squat in standing .  Fearful of falling.      PT Next Visit Plan  Basic back,  strengthening hips knees,  consider mini squats in parallel bars.  step ups there too.      PT Home Exercise Plan  SLR, Quad set LAQ seated hamstring, LTR, PPT, physio/chair rol outs for lumbar flexion.  Sitting abdominal isometrics     Consulted and Agree with Plan of Care  Patient       Patient will benefit from skilled therapeutic intervention in order to improve the following deficits and impairments:     Visit Diagnosis: Chronic bilateral low back pain, unspecified whether sciatica present  Chronic pain  of left knee  Difficulty in walking, not elsewhere classified  Muscle weakness (generalized)  Abnormal posture     Problem List Patient Active Problem List   Diagnosis Date Noted  . Hx of adenomatous colonic polyps   . Benign neoplasm of transverse colon   . Screening for colon cancer   . Benign neoplasm of ascending colon   . Benign neoplasm of rectosigmoid junction     ,  PTA 02/15/2018, 4:39 PM  [facility] [address] [place], [place], [address] Phone: [phone]   Fax:  [phone]  Name: Renee Durham MRN: [id number] Date of Birth: 1955-08-14

## 2018-02-19 ENCOUNTER — Encounter: Payer: Self-pay | Admitting: Physical Therapy

## 2018-02-19 ENCOUNTER — Ambulatory Visit: Payer: 59 | Admitting: Physical Therapy

## 2018-02-19 DIAGNOSIS — M545 Low back pain: Principal | ICD-10-CM

## 2018-02-19 DIAGNOSIS — M6281 Muscle weakness (generalized): Secondary | ICD-10-CM

## 2018-02-19 DIAGNOSIS — R293 Abnormal posture: Secondary | ICD-10-CM

## 2018-02-19 DIAGNOSIS — R262 Difficulty in walking, not elsewhere classified: Secondary | ICD-10-CM

## 2018-02-19 DIAGNOSIS — M25562 Pain in left knee: Secondary | ICD-10-CM

## 2018-02-19 DIAGNOSIS — G8929 Other chronic pain: Secondary | ICD-10-CM

## 2018-02-19 NOTE — Therapy (Signed)
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 Anemia   . Arthritis    knees/back  . Chronic kidney disease   . H/O sickle cell trait   . Hypertension   . Obesities, morbid [facility])     Past Surgical History:  Procedure Laterality Date  . BIOPSY  08/08/2017   Procedure: BIOPSY;  Surgeon: Jerene Bears, MD;  Location: [facility];  Service: Gastroenterology;;  . BREAST CYST EXCISION    . COLONOSCOPY N/A 04/21/2014   Procedure: COLONOSCOPY;  Surgeon: Jerene Bears, MD;  Location: [facility];  Service: Gastroenterology;  Laterality: N/A;  . COLONOSCOPY WITH PROPOFOL N/A 08/08/2017   Procedure: COLONOSCOPY WITH PROPOFOL;  Surgeon: Jerene Bears, MD;  Location: [facility];  Service: Gastroenterology;  Laterality: N/A;  . EXTREMITY CYST EXCISION Right    right wrist  . HERNIA REPAIR     umbilical  . KNEE SURGERY     scope  . POLYPECTOMY    . POLYPECTOMY  08/08/2017   Procedure: POLYPECTOMY;  Surgeon: Jerene Bears, MD;  Location: [facility];  Service: Gastroenterology;;  . uterine ablation      There were no vitals filed for this visit.  Subjective Assessment - 02/19/18 0937    Subjective  Feeling pretty good today in back and knees.    Currently in Pain?  No/denies     Pain Location  Back    Pain Orientation  Right;Lower    Aggravating Factors   longer walking    Pain Relieving Factors  relaxing    Effect of Pain on Daily Activities  needs 2 canes    Pain Location  Knee    Pain Orientation  Right    Pain Descriptors / Indicators  --   nerve pain   Pain Radiating Towards  anterior shin when she works on standing knee mini squats.    6/10   Aggravating Factors   stepping up on curve     Pain Relieving Factors  sitting,  walking with knees stiff , hyper extended.                        [place] Adult PT Treatment/Exercise - 02/19/18 0001      Lumbar Exercises: Stretches   Other Lumbar Stretch Exercise  seated lumbar stretch sitting sliding arms down bars 10 X      Lumbar Exercises: Seated   Other Seated Lumbar Exercises  isometric abdominals both and single hands  HEP   pressing green ball,       Knee/Hip Exercises: Stretches   Piriformis Stretch  Both;2 reps;30 seconds  Piriformis Stretch Limitations  incline,  toes only  cues      Knee/Hip Exercises: Standing   Heel Raises  10 reps    Heel Raises Limitations  also rocker board PF/DF 2 minutes    Forward Step Up Limitations  2 x 6 inches left in bars  compensates locks knee  does not use quads.      Functional Squat  5 reps    Functional Squat Limitations  compensations, knee pain  CGA,  challanging.  in bars.       Knee/Hip Exercises: Seated   Long Arc Quad  Strengthening;Both;10 reps;Weights    Long Arc Quad Weight  2 lbs.    Long Arc Quad Limitations  10 second hold             PT Education - 02/19/18 1016    Education Details  exercise form    Person(s) Educated  Patient    Methods  Explanation;Demonstration;Tactile cues;Verbal cues    Comprehension  Returned demonstration;Verbalized understanding       PT Short Term Goals - 01/16/18 1139      PT SHORT TERM GOAL #1   Title  STG=LTG        PT Long Term Goals - 02/19/18 1325      PT LONG TERM GOAL #1    Title  Pt will be independent with advanced HEP    Baseline  compliant so far independent    Time  6    Period  Weeks    Status  On-going      PT LONG TERM GOAL #2   Time  6    Period  Weeks    Status  Unable to assess      PT LONG TERM GOAL #3   Title  "Pt will be able to negotiate steps without exacerbation of pain greater than 2/10     Baseline  pain 5/10 on steps or more    Time  6    Period  Weeks    Status  On-going      PT LONG TERM GOAL #4   Title  "Pt will improve her L knee flexion to  </= 115 degrees and extension to </= 5 degrees with </= 2/10 pain for a more functional and efficient gait pattern    Time  6    Period  Weeks    Status  Unable to assess      PT LONG TERM GOAL #5   Title  "Pt will improve LE  strength to >/= 4+/5 with </= 2/10 pain to promote safety with walking/standing activities and LRAD    Baseline  address with exercise    Time  6    Period  Weeks    Status  On-going      PT LONG TERM GOAL #6   Title  Pt will perform 5 x STS in < 14 sec to show LE strength and reduction of fall risk    Period  Weeks    Status  Unable to assess      PT LONG TERM GOAL #7   Title  Pt will be able to stand for 24min to 1 hour in order to complete household chores    Baseline  only able to stand 20-30 min    Time  6    Period  Weeks    Status  On-going            Plan - 02/19/18 1324  Clinical Impression Statement  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  patient less fearful of exercise in standing.  Work continues on [employer].  She stands with knees hyperextended.  Back pain is improving.   PT Next Visit Plan  Basic back,  strengthening hips knees,  consider mini squats in parallel bars.  step ups there too.      PT Home Exercise Plan  SLR, Quad set LAQ seated hamstring, LTR, PPT, physio/chair rol outs for lumbar flexion.  Sitting abdominal isometrics     Consulted and Agree with Plan of Care  Patient       Patient will benefit from skilled therapeutic  intervention in order to improve the following deficits and impairments:     Visit Diagnosis: Chronic bilateral low back pain, unspecified whether sciatica present  Chronic pain of left knee  Difficulty in walking, not elsewhere classified  Muscle weakness (generalized)  Abnormal posture     Problem List Patient Active Problem List   Diagnosis Date Noted  . Hx of adenomatous colonic polyps   . Benign neoplasm of transverse colon   . Screening for colon cancer   . Benign neoplasm of ascending colon   . Benign neoplasm of rectosigmoid junction     HARRIS,KAREN  PTA 02/19/2018, 1:27 PM  [facility] [address] [place], [place], [address] Phone: [phone]   Fax:  [phone]  Name: Latricia BRILYNN BIASI MRN: [id number] Date of Birth: 18-Jan-1956

## 2018-02-22 ENCOUNTER — Encounter: Payer: Self-pay | Admitting: Physical Therapy

## 2018-02-22 ENCOUNTER — Ambulatory Visit: Payer: 59 | Admitting: Physical Therapy

## 2018-02-22 DIAGNOSIS — R262 Difficulty in walking, not elsewhere classified: Secondary | ICD-10-CM

## 2018-02-22 DIAGNOSIS — M25562 Pain in left knee: Secondary | ICD-10-CM

## 2018-02-22 DIAGNOSIS — R293 Abnormal posture: Secondary | ICD-10-CM

## 2018-02-22 DIAGNOSIS — M545 Low back pain, unspecified: Secondary | ICD-10-CM

## 2018-02-22 DIAGNOSIS — G8929 Other chronic pain: Secondary | ICD-10-CM

## 2018-02-22 DIAGNOSIS — M6281 Muscle weakness (generalized): Secondary | ICD-10-CM

## 2018-02-22 NOTE — Therapy (Signed)
[place], [place], [address] Phone: [phone]   Fax:  [phone]  Physical Therapy Treatment / discharge summary  Patient Details  Name: Renee Durham MRN: [id number] Date of Birth: 09-13-1955 Referring Provider (PT): Eunice Blase MD   Encounter Date: 02/22/2018  PT End of Session - 02/22/18 0940    Visit Number  8    Number of Visits  13    Date for PT Re-Evaluation  02/27/18    Authorization Type  [employer], [employer] benefit plan    PT Start Time  [phone]    PT Stop Time  1005   shortened session due to discharge   PT Time Calculation (min)  32 min    Activity Tolerance  Patient tolerated treatment well    Behavior During Therapy  [facility] for tasks assessed/performed       Past Medical History:  Diagnosis Date  . Anemia   . Arthritis    knees/back  . Chronic kidney disease   . H/O sickle cell trait   . Hypertension   . Obesities, morbid [facility])     Past Surgical History:  Procedure Laterality Date  . BIOPSY  08/08/2017   Procedure: BIOPSY;  Surgeon: Jerene Bears, MD;  Location: [facility];  Service: Gastroenterology;;  . BREAST CYST EXCISION    . COLONOSCOPY N/A 04/21/2014   Procedure: COLONOSCOPY;  Surgeon: Jerene Bears, MD;  Location: [facility];  Service: Gastroenterology;  Laterality: N/A;  . COLONOSCOPY WITH PROPOFOL N/A 08/08/2017   Procedure: COLONOSCOPY WITH PROPOFOL;  Surgeon: Jerene Bears, MD;  Location: [facility];  Service: Gastroenterology;  Laterality: N/A;  . EXTREMITY CYST EXCISION Right    right wrist  . HERNIA REPAIR     umbilical  . KNEE SURGERY     scope  . POLYPECTOMY    . POLYPECTOMY  08/08/2017   Procedure: POLYPECTOMY;  Surgeon: Jerene Bears, MD;  Location: [facility];  Service: Gastroenterology;;  . uterine ablation      There were no vitals filed for this visit.  Subjective Assessment - 02/22/18 0935    Subjective  "I am doing pretty good, the  cold does make me feel alittle tight"     Currently in Pain?  Yes    Pain Score  0-No pain    Pain Location  Back    Pain Orientation  Right;Left    Pain Type  Chronic pain    Pain Onset  More than a month ago    Aggravating Factors   lifting/ standing,     Pain Relieving Factors  sitting down and relaxer, exercise    Pain Score  0    Pain Orientation  Right    Pain Onset  More than a month ago    Pain Frequency  Intermittent    Aggravating Factors   standing/ walking    Pain Relieving Factors  sitting, exercise.          [facility] PT Assessment - 02/22/18 0001      Assessment   Medical Diagnosis  low back pain and left knee    Referring Provider (PT)  Hilts, Michael MD    Onset Date/Surgical Date  11/28/17    Next MD Visit  to make one      Observation/Other Assessments   Focus on Therapeutic Outcomes (FOTO)   43% limited      AROM   Left Knee Extension  5  Left Knee Flexion  106    Lumbar Flexion  90    Lumbar Extension  25    Lumbar - Right Side Bend  23    Lumbar - Left Side Bend  25      Strength   Right Hip Flexion  4+/5    Right Hip Extension  4/5    Right Hip External Rotation   4/5    Right Hip Internal Rotation  4/5    Right Hip ABduction  4/5    Left Hip Flexion  5/5    Left Hip Extension  4/5    Left Hip External Rotation  4/5    Left Hip Internal Rotation  4/5    Left Hip ABduction  4/5    Right Knee Flexion  4+/5   pain during testing   Right Knee Extension  4+/5    Left Knee Flexion  5/5    Left Knee Extension  5/5                   [facility] Adult PT Treatment/Exercise - 02/22/18 0001      Lumbar Exercises: Seated   Long Arc Quad on Chair  Both;1 set;10 reps      Knee/Hip Exercises: Standing   Hip Abduction  1 set;10 reps;Stengthening;Knee straight    Hip Extension  Stengthening;1 set;10 reps;Knee straight             PT Education - 02/22/18 1012    Education Details  reviewed previously provided HEP and updated for  standing hip strength. importance of continuing exercises to maintain and promote strength/ endurance.     Person(s) Educated  Patient    Methods  Explanation;Verbal cues;Handout    Comprehension  Verbalized understanding;Verbal cues required       PT Short Term Goals - 01/16/18 1139      PT SHORT TERM GOAL #1   Title  STG=LTG        PT Long Term Goals - 02/22/18 0947      PT LONG TERM GOAL #1   Title  Pt will be independent with advanced HEP      PT LONG TERM GOAL #2   Title  "FOTO will improve from  53%limitation   to   44% limitation  indicating improved functional mobility.     Baseline  43% limited     Time  6    Period  Weeks    Status  Achieved      PT LONG TERM GOAL #3   Title  "Pt will be able to negotiate steps without exacerbation of pain greater than 2/10     Time  6    Period  Weeks    Status  Achieved      PT LONG TERM GOAL #4   Title  "Pt will improve her L knee flexion to  </= 115 degrees and extension to </= 5 degrees with </= 2/10 pain for a more functional and efficient gait pattern    Baseline  106 flexion, extensin 5 degreees with no pain     Time  6    Period  Weeks    Status  Partially Met      PT LONG TERM GOAL #5   Title  "Pt will improve LE  strength to >/= 4+/5 with </= 2/10 pain to promote safety with walking/standing activities and LRAD    Baseline  no pain during testing on the L  Time  6    Period  Weeks    Status  Achieved      PT LONG TERM GOAL #6   Title  Pt will perform 5 x STS in < 14 sec to show LE strength and reduction of fall risk    Baseline  17 seconds    Period  Weeks    Status  Partially Met      PT LONG TERM GOAL #7   Title  Pt will be able to stand for 88mn to 1 hour in order to complete household chores    Baseline  reports she is able to perform the activity having to sit down occasionally    Period  Weeks    Status  Achieved            Plan - 02/22/18 1011    Clinical Impression Statement  pt has  made significant improvement in trunk and L knee ROM and LE strength and additionally reports no pain today. She met or partially met all goals today. she is able to perofrm all exercises today with no report of pain or soreness. She is able ot maintain and progress her current level of function independently and will be discharged from PT today.     PT Next Visit Plan  d/c    PT Home Exercise Plan  SLR, Quad set LAQ seated hamstring, LTR, PPT, physio/chair rol outs for lumbar flexion.  Sitting abdominal isometrics , stand hp abduction/ extension     Consulted and Agree with Plan of Care  Patient       Patient will benefit from skilled therapeutic intervention in order to improve the following deficits and impairments:  Obesity, Pain, Decreased endurance, Decreased activity tolerance, Decreased strength, Increased fascial restricitons, Improper body mechanics, Postural dysfunction, Decreased safety awareness, Decreased range of motion, Increased muscle spasms, Impaired flexibility, Decreased mobility  Visit Diagnosis: Chronic bilateral low back pain, unspecified whether sciatica present  Chronic pain of left knee  Difficulty in walking, not elsewhere classified  Muscle weakness (generalized)  Abnormal posture     Problem List Patient Active Problem List   Diagnosis Date Noted  . Hx of adenomatous colonic polyps   . Benign neoplasm of transverse colon   . Screening for colon cancer   . Benign neoplasm of ascending colon   . Benign neoplasm of rectosigmoid junction    Tyee Vandevoorde PT, DPT, LAT, ATC  02/22/18  10:14 AM      [facility] [address]Phone: [phone]  Fax:  [phone] Name: Renee AUVILMRN: [id number] of Birth: [birth year]    PHYSICAL THERAPY DISCHARGE SUMMARY  Visits from Start of Care: 8  Current functional level related to goals / functional  outcomes: See goals, FOTO 43% limited   Remaining deficits: Limited prolonged walking/ standing but pt reports its improving. See above assessment   Education / Equipment: HEP, theraband, posture, lifting mechanics,   Plan: Patient agrees to discharge.  Patient goals were partially met. Patient is being discharged due to being pleased with the current functional level.  ?????          Jaramiah Bossard PT, DPT, LAT, ATC  02/22/18  10:15 AM

## 2018-02-23 ENCOUNTER — Ambulatory Visit (INDEPENDENT_AMBULATORY_CARE_PROVIDER_SITE_OTHER): Payer: 59 | Admitting: Family Medicine

## 2018-02-23 ENCOUNTER — Encounter (INDEPENDENT_AMBULATORY_CARE_PROVIDER_SITE_OTHER): Payer: Self-pay | Admitting: Family Medicine

## 2018-02-23 DIAGNOSIS — G8929 Other chronic pain: Secondary | ICD-10-CM | POA: Diagnosis not present

## 2018-02-23 DIAGNOSIS — M545 Low back pain, unspecified: Secondary | ICD-10-CM

## 2018-02-23 DIAGNOSIS — M25562 Pain in left knee: Secondary | ICD-10-CM
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
## 2018-09-24 ENCOUNTER — Encounter: Payer: Self-pay | Admitting: Podiatry

## 2018-09-24 ENCOUNTER — Ambulatory Visit: Payer: 59 | Admitting: Podiatry

## 2018-09-24 ENCOUNTER — Other Ambulatory Visit: Payer: Self-pay

## 2018-09-24 VITALS — BP 155/84 | HR 66 | Temp 98.3°F

## 2018-09-24 DIAGNOSIS — M79676 Pain in unspecified toe(s): Secondary | ICD-10-CM

## 2018-09-24 DIAGNOSIS — Z79899 Other long term (current) drug therapy: Secondary | ICD-10-CM | POA: Diagnosis not present

## 2018-09-24 DIAGNOSIS — B351 Tinea unguium: Secondary | ICD-10-CM | POA: Diagnosis not present

## 2018-09-24 MED ORDER — TERBINAFINE HCL 250 MG PO TABS: 250.0000 mg | ORAL_TABLET | Freq: Every day | ORAL | 0 refills | Status: DC

## 2018-09-25 LAB — HEPATIC FUNCTION PANEL
AG Ratio: 1.3 (calc) (ref 1.0–2.5)
ALT: 17 U/L (ref 6–29)
AST: 23 U/L (ref 10–35)
Albumin: 3.9 g/dL (ref 3.6–5.1)
Alkaline phosphatase (APISO): 63 U/L (ref 37–153)
Bilirubin, Direct: 0.1 mg/dL (ref 0.0–0.2)
Globulin: 3 g/dL (calc) (ref 1.9–3.7)
Indirect Bilirubin: 0.5 mg/dL (calc) (ref 0.2–1.2)
Total Bilirubin: 0.6 mg/dL (ref 0.2–1.2)
Total Protein: 6.9 g/dL (ref 6.1–8.1)

## 2018-09-26 NOTE — Progress Notes (Signed)
   Subjective: 63 y.o. female presenting today as a new patient with a chief complaint of possible nail fungus noted to the 2nd and 3rd toenails of the right foot that appeared several months ago. She reports associated discoloration, thickening and intermittent tenderness of the nails. Walking in shoes increases the pain. She has not had any treatment for her symptoms. Patient is here for further evaluation and treatment.   Past Medical History:  Diagnosis Date  . Anemia   . Arthritis    knees/back  . Chronic kidney disease   . H/O sickle cell trait   . Hypertension   . Obesities, morbid ([place])     Objective: Physical Exam General: The patient is alert and oriented x3 in no acute distress.  Dermatology: Hyperkeratotic, discolored, thickened, onychodystrophy of the 2nd and 3rd toenails of the right foot. Skin is warm, dry and supple bilateral lower extremities. Negative for open lesions or macerations.  Vascular: Palpable pedal pulses bilaterally. No edema or erythema noted. Capillary refill within normal limits.  Neurological: Epicritic and protective threshold grossly intact bilaterally.   Musculoskeletal Exam: Range of motion within normal limits to all pedal and ankle joints bilateral. Muscle strength 5/5 in all groups bilateral.   Assessment: #1 Onychomycosis noted to the 2nd and 3rd toenails right #2 Hyperkeratotic nails 2nd and 3rd toenails right  Plan of Care:  #1 Patient was evaluated. #2 Mechanical debridement of nails 2 and 3 of the right foot performed using a nail nipper. Filed with dremel without incident.  #3 Prescription for Lamisil 250 mg #90 provided to patient.  #4 Orders for LFT placed.  #5 Return to clinic as needed.    Edrick Kins, DPM [facility]  Dr[address] Edrick Kins, [place]                                        [place], [place] [address]                Office [phone]  Fax [phone]

## 2018-11-12 ENCOUNTER — Inpatient Hospital Stay (HOSPITAL_COMMUNITY)
Admission: EM | Admit: 2018-11-12 | Discharge: 2018-11-15 | DRG: 193 | Disposition: A | Discharge status: Home Health | Payer: 59 | Attending: Internal Medicine | Admitting: Internal Medicine

## 2018-11-12 ENCOUNTER — Emergency Department (HOSPITAL_COMMUNITY): Payer: 59

## 2018-11-12 ENCOUNTER — Other Ambulatory Visit: Payer: Self-pay

## 2018-11-12 DIAGNOSIS — E662 Morbid (severe) obesity with alveolar hypoventilation: Secondary | ICD-10-CM | POA: Diagnosis present

## 2018-11-12 DIAGNOSIS — I130 Hypertensive heart and chronic kidney disease with heart failure and stage 1 through stage 4 chronic kidney disease, or unspecified chronic kidney disease: Secondary | ICD-10-CM | POA: Diagnosis present

## 2018-11-12 DIAGNOSIS — J189 Pneumonia, unspecified organism: Principal | ICD-10-CM | POA: Diagnosis present

## 2018-11-12 DIAGNOSIS — Z7982 Long term (current) use of aspirin: Secondary | ICD-10-CM

## 2018-11-12 DIAGNOSIS — M170 Bilateral primary osteoarthritis of knee: Secondary | ICD-10-CM | POA: Diagnosis present

## 2018-11-12 DIAGNOSIS — E8779 Other fluid overload: Secondary | ICD-10-CM | POA: Diagnosis present

## 2018-11-12 DIAGNOSIS — Z20828 Contact with and (suspected) exposure to other viral communicable diseases: Secondary | ICD-10-CM | POA: Diagnosis present

## 2018-11-12 DIAGNOSIS — E8770 Fluid overload, unspecified: Secondary | ICD-10-CM

## 2018-11-12 DIAGNOSIS — I509 Heart failure, unspecified: Secondary | ICD-10-CM | POA: Diagnosis present

## 2018-11-12 DIAGNOSIS — D573 Sickle-cell trait: Secondary | ICD-10-CM | POA: Diagnosis present

## 2018-11-12 DIAGNOSIS — R0602 Shortness of breath: Secondary | ICD-10-CM | POA: Diagnosis not present

## 2018-11-12 DIAGNOSIS — R32 Unspecified urinary incontinence: Secondary | ICD-10-CM | POA: Diagnosis present

## 2018-11-12 DIAGNOSIS — R0902 Hypoxemia: Secondary | ICD-10-CM | POA: Diagnosis present

## 2018-11-12 DIAGNOSIS — Z79899 Other long term (current) drug therapy: Secondary | ICD-10-CM

## 2018-11-12 DIAGNOSIS — M479 Spondylosis, unspecified: Secondary | ICD-10-CM | POA: Diagnosis present

## 2018-11-12 DIAGNOSIS — Z6841 Body Mass Index (BMI) 40.0 and over, adult: Secondary | ICD-10-CM | POA: Diagnosis not present

## 2018-11-12 DIAGNOSIS — J9601 Acute respiratory failure with hypoxia: Secondary | ICD-10-CM | POA: Diagnosis present

## 2018-11-12 DIAGNOSIS — N182 Chronic kidney disease, stage 2 (mild): Secondary | ICD-10-CM | POA: Diagnosis present

## 2018-11-12 DIAGNOSIS — R9431 Abnormal electrocardiogram [ECG] [EKG]: Secondary | ICD-10-CM | POA: Diagnosis not present

## 2018-11-12 DIAGNOSIS — I5031 Acute diastolic (congestive) heart failure: Secondary | ICD-10-CM | POA: Diagnosis present

## 2018-11-12 DIAGNOSIS — E66813 Obesity, class 3: Secondary | ICD-10-CM

## 2018-11-12 LAB — CBC WITH DIFFERENTIAL/PLATELET
Abs Immature Granulocytes: 0.05 10*3/uL (ref 0.00–0.07)
Basophils Absolute: 0.1 10*3/uL (ref 0.0–0.1)
Basophils Relative: 1 %
Eosinophils Absolute: 0.3 10*3/uL (ref 0.0–0.5)
Eosinophils Relative: 2 %
HCT: 41.8 % (ref 36.0–46.0)
Hemoglobin: 12.2 g/dL (ref 12.0–15.0)
Immature Granulocytes: 1 %
Lymphocytes Relative: 12 %
Lymphs Abs: 1.2 10*3/uL (ref 0.7–4.0)
MCH: 22.7 pg — ABNORMAL LOW (ref 26.0–34.0)
MCHC: 29.2 g/dL — ABNORMAL LOW (ref 30.0–36.0)
MCV: 77.8 fL — ABNORMAL LOW (ref 80.0–100.0)
Monocytes Absolute: 0.9 10*3/uL (ref 0.1–1.0)
Monocytes Relative: 9 %
Neutro Abs: 7.8 10*3/uL — ABNORMAL HIGH (ref 1.7–7.7)
Neutrophils Relative %: 75 %
Platelets: 314 10*3/uL (ref 150–400)
RBC: 5.37 MIL/uL — ABNORMAL HIGH (ref 3.87–5.11)
RDW: 19.6 % — ABNORMAL HIGH (ref 11.5–15.5)
WBC: 10.2 10*3/uL (ref 4.0–10.5)
nRBC: 0 % (ref 0.0–0.2)

## 2018-11-12 LAB — COMPREHENSIVE METABOLIC PANEL
ALT: 20 U/L (ref 0–44)
AST: 25 U/L (ref 15–41)
Albumin: 4 g/dL (ref 3.5–5.0)
Alkaline Phosphatase: 78 U/L (ref 38–126)
Anion gap: 13 (ref 5–15)
BUN: 15 mg/dL (ref 6–20)
CO2: 25 mmol/L (ref 22–32)
Calcium: 8.9 mg/dL (ref 8.9–10.3)
Chloride: 105 mmol/L (ref 98–111)
Creatinine, Ser: 1.06 mg/dL — ABNORMAL HIGH (ref 0.44–1.00)
GFR calc Af Amer: >60 mL/min (ref >60)
GFR calc non Af Amer: 57 mL/min — ABNORMAL LOW (ref >60)
Glucose, Bld: 94 mg/dL (ref 70–99)
Potassium: 4.2 mmol/L (ref 3.5–5.1)
Sodium: 143 mmol/L (ref 135–145)
Total Bilirubin: 0.9 mg/dL (ref 0.3–1.2)
Total Protein: 7.6 g/dL (ref 6.5–8.1)

## 2018-11-12 LAB — TROPONIN I (HIGH SENSITIVITY)
Troponin I (High Sensitivity): 7 ng/L (ref <18)
Troponin I (High Sensitivity): 6 ng/L (ref <18)

## 2018-11-12 LAB — SARS CORONAVIRUS 2 BY RT PCR (HOSPITAL ORDER, PERFORMED IN ~~LOC~~ HOSPITAL LAB): SARS Coronavirus 2: NEGATIVE

## 2018-11-12 LAB — BRAIN NATRIURETIC PEPTIDE: B Natriuretic Peptide: 136.3 pg/mL — ABNORMAL HIGH (ref 0.0–100.0)

## 2018-11-12 MED ORDER — ASPIRIN EC 81 MG PO TBEC
81.0000 mg | DELAYED_RELEASE_TABLET | Freq: Every day | ORAL | Status: DC
  Administered 2018-11-13 – 2018-11-15 (×3): 81 mg via ORAL
  Filled 2018-11-12 (×3): qty 1

## 2018-11-12 MED ORDER — POTASSIUM CHLORIDE CRYS ER 20 MEQ PO TBCR
20.0000 meq | EXTENDED_RELEASE_TABLET | Freq: Two times a day (BID) | ORAL | Status: DC
  Administered 2018-11-12 – 2018-11-15 (×6): 20 meq via ORAL
  Filled 2018-11-12 (×6): qty 1

## 2018-11-12 MED ORDER — ENOXAPARIN SODIUM 40 MG/0.4ML ~~LOC~~ SOLN: 40.0000 mg | SUBCUTANEOUS | Status: DC

## 2018-11-12 MED ORDER — OXYBUTYNIN CHLORIDE ER 10 MG PO TB24
10.0000 mg | ORAL_TABLET | Freq: Every day | ORAL | Status: DC
  Administered 2018-11-13 – 2018-11-15 (×3): 10 mg via ORAL
  Filled 2018-11-12 (×3): qty 1

## 2018-11-12 MED ORDER — ALBUTEROL SULFATE (2.5 MG/3ML) 0.083% IN NEBU: 3.0000 mL | INHALATION_SOLUTION | Freq: Four times a day (QID) | RESPIRATORY_TRACT | Status: DC | PRN

## 2018-11-12 MED ORDER — FUROSEMIDE 10 MG/ML IJ SOLN
40.0000 mg | Freq: Once | INTRAMUSCULAR | Status: AC
  Administered 2018-11-12: 40 mg via INTRAVENOUS
  Filled 2018-11-12: qty 4

## 2018-11-12 MED ORDER — ENOXAPARIN SODIUM 40 MG/0.4ML ~~LOC~~ SOLN
40.0000 mg | SUBCUTANEOUS | Status: DC
  Administered 2018-11-12: 40 mg via SUBCUTANEOUS
  Filled 2018-11-12: qty 0.4

## 2018-11-12 MED ORDER — FUROSEMIDE 10 MG/ML IJ SOLN
40.0000 mg | Freq: Two times a day (BID) | INTRAMUSCULAR | Status: DC
  Administered 2018-11-13 – 2018-11-14 (×4): 40 mg via INTRAVENOUS
  Filled 2018-11-12 (×5): qty 4

## 2018-11-12 MED ORDER — METOPROLOL SUCCINATE ER 100 MG PO TB24
200.0000 mg | ORAL_TABLET | Freq: Every day | ORAL | Status: DC
  Administered 2018-11-13: 200 mg via ORAL
  Filled 2018-11-12 (×3): qty 2

## 2018-11-12 MED ORDER — BACLOFEN 10 MG PO TABS: 10.0000 mg | ORAL_TABLET | Freq: Three times a day (TID) | ORAL | Status: DC | PRN

## 2018-11-12 NOTE — ED Notes (Signed)
Report given to Katie, RN.

## 2018-11-12 NOTE — ED Provider Notes (Signed)
MSE was initiated and I personally evaluated the patient and placed orders (if any) at  2:56 PM on November 12, 2018.  The patient appears stable so that the remainder of the MSE may be completed by another provider.  HPI Patient is a 63 year old female with history of HTN, obesity, CKD presenting for 2.5 weeks of progressive, constant shortness of breath that is worse at night and on exertion and better with sitting upright and with oxygen.   Patient states she has a long history of swollen legs and states that her legs look less swollen today than usual.  Patient is able to ambulate with 2 canes states she is usually able to walk well but her progressive dyspnea has shortened her walking distance of 10 feet before she has to stop for rest.  Patient states that she has an albuterol inhaler she was given in the past for bronchitis that she has not had to use a long time use of her staff today with minor improvement of symptoms.  Patient does not have any other inhalers that she uses at home patient states that she is on a milligrams of Lasix waiting at night but missed her dose this morning.  Patient states she misses at most 1 dose per week and every 2 doses of the same day.   Patient states she was hospitalized years ago for shortness of breath and told she had fluid around her heart.  Patient does not recall other details of this hospitalization.  Per chart review: Patient was hospitalized in 2002 for acute hypoxic respiratory failure and admitted to [facility].  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     Physical Exam Patient is able to speak in full sentences visibly short of breath.  SPO2 97% on 4 L of oxygen by nasal cannula.  Patient is mentating well able to answer questions and follow commands. No JVD appreciated.  Significant lower extremity edema equal bilaterally 3+. No murmurs  rubs or gallops, crackles in lower bases. Abdomen soft, nontender. Cap refill less than 2 seconds.    A/P Patient has history of acute hypoxic respiratory distress in the past.  3 days hospitalization.  Based on patient presentation she will likely need hospitalization daily due to new oxygen requirement and marked shortness of breath.      Pati Gallo [place], [place] 11/12/18 1537    Fredia Sorrow, MD 11/24/18 [phone]

## 2018-11-12 NOTE — H&P (Signed)
History and Physical    Renee Durham [id number] DOB: [birth year] DOA: 11/12/2018  PCP: Bartholome Bill, MD Patient coming from home  Chief Complaint: Shortness of breath  HPI: Renee Durham is a 63 y.o. female with medical history significant of CKD, obesity morbid, hypertension came in with complaints of worsening shortness of breath gradually progressive for the last few days prior to admission to the hospital.  She has gained 25 pounds in the last year or so.  She checks her weight at home and she called her PCP few months ago and PCP was trying to get her into sleep clinic to get a sleep study to rule out sleep apnea.  Patient lives at home with her family but not very active.  She walks with a walker.  She reports she is afraid to walk alone for risk of falls.  She has dyspnea on exertion and shortness of breath.  Her saturation dropped to 83% on room air in the ER.  She does not have oxygen at home.  She denied any fever chills cough nausea vomiting diarrhea chest pain  or abdominal pain.  She denied any urinary complaints.    ED Course: Patient received Lasix sodium 143 potassium 4.2 BUN 15 creatinine 1.06, BNP was 136 troponin was 7 white count 10.2 hemoglobin 12.2 platelet count 314, COVID negative.  Chest x-ray shows early infiltrates versus bronchitis.  Review of Systems: As per HPI otherwise all other systems reviewed and are negative  Ambulatory Status: Walks with a walker at home not very active  Past Medical History:  Diagnosis Date   Anemia    Arthritis    knees/back   Chronic kidney disease    H/O sickle cell trait    Hypertension    Obesities, morbid ([place])     Past Surgical History:  Procedure Laterality Date   BIOPSY  08/08/2017   Procedure: BIOPSY;  Surgeon: Jerene Bears, MD;  Location: [facility];  Service: Gastroenterology;;   BREAST CYST EXCISION     COLONOSCOPY N/A 04/21/2014   Procedure: COLONOSCOPY;  Surgeon: Jerene Bears, MD;   Location: [facility];  Service: Gastroenterology;  Laterality: N/A;   COLONOSCOPY WITH PROPOFOL N/A 08/08/2017   Procedure: COLONOSCOPY WITH PROPOFOL;  Surgeon: Jerene Bears, MD;  Location: [facility];  Service: Gastroenterology;  Laterality: N/A;   EXTREMITY CYST EXCISION Right    right wrist   HERNIA REPAIR     umbilical   KNEE SURGERY     scope   POLYPECTOMY     POLYPECTOMY  08/08/2017   Procedure: POLYPECTOMY;  Surgeon: Jerene Bears, MD;  Location: [facility];  Service: Gastroenterology;;   uterine ablation      Social History   Socioeconomic History   Marital status: Divorced    Spouse name: Not on file   Number of children: Not on file   Years of education: Not on file   Highest education level: Not on file  Occupational History   Not on file  Social Needs   Financial resource strain: Not on file   Food insecurity    Worry: Not on file    Inability: Not on file   Transportation needs    Medical: Not on file    Non-medical: Not on file  Tobacco Use   Smoking status: Never Smoker   Smokeless tobacco: Never Used  Substance and Sexual Activity   Alcohol use: No    Alcohol/week: 0.0 standard drinks  Drug use: No   Sexual activity: Not Currently  Lifestyle   Physical activity    Days per week: Not on file    Minutes per session: Not on file   Stress: Not on file  Relationships   Social connections    Talks on phone: Not on file    Gets together: Not on file    Attends religious service: Not on file    Active member of club or organization: Not on file    Attends meetings of clubs or organizations: Not on file    Relationship status: Not on file   Intimate partner violence    Fear of current or ex partner: Not on file    Emotionally abused: Not on file    Physically abused: Not on file    Forced sexual activity: Not on file  Other Topics Concern   Not on file  Social History Narrative   Not on file    No Known  Allergies  Family History  Problem Relation Age of Onset   Breast cancer Sister    Breast cancer Sister    Colon cancer Neg Hx    Esophageal cancer Neg Hx    Rectal cancer Neg Hx    Stomach cancer Neg Hx       Prior to Admission medications   Medication Sig Start Date End Date Taking? Authorizing Provider  acetaminophen (TYLENOL) 500 MG tablet Take 500-1,000 mg by mouth every 6 (six) hours as needed for mild pain or moderate pain.   Yes [provider]  albuterol (PROVENTIL HFA;VENTOLIN HFA) 108 (90 Base) MCG/ACT inhaler Inhale 2 puffs into the lungs every 6 (six) hours as needed for wheezing or shortness of breath.  11/07/17  Yes [provider]  amLODipine (NORVASC) 10 MG tablet Take 10 mg by mouth every morning.  09/30/13  Yes [provider]  aspirin EC 81 MG tablet Take 81 mg by mouth daily.  09/08/09  Yes [provider]  baclofen (LIORESAL) 10 MG tablet TAKE 1 TABLET BY MOUTH THREE TIMES A DAY AS NEEDED FOR MUSCLE SPASMS Patient taking differently: Take 10 mg by mouth 3 (three) times daily as needed for muscle spasms.  01/26/18  Yes Hilts, Legrand Como, MD  diclofenac sodium (VOLTAREN) 1 % GEL Apply 4 g topically 4 (four) times daily as needed. 12/04/17  Yes Hilts, Michael, MD  ferrous sulfate 325 (65 FE) MG tablet Take by mouth.   Yes [provider]  furosemide (LASIX) 20 MG tablet Take 20 mg by mouth daily.  01/26/17  Yes [provider]  losartan (COZAAR) 100 MG tablet Take 100 mg by mouth every morning.  09/30/13  Yes [provider]  methocarbamol (ROBAXIN) 500 MG tablet Take 500 mg by mouth daily as needed for muscle spasms.  11/17/14  Yes [provider]  metoprolol (TOPROL-XL) 200 MG 24 hr tablet Take 200 mg by mouth at bedtime.  10/14/13  Yes [provider]  oxybutynin (DITROPAN-XL) 10 MG 24 hr tablet Take 10 mg by mouth daily. 07/13/17  Yes [provider]  terbinafine (LAMISIL) 250 MG  tablet Take 1 tablet (250 mg total) by mouth daily. 09/24/18  Yes Edrick Kins, DPM    Physical Exam: Vitals:   11/12/18 1501 11/12/18 1530 11/12/18 1600 11/12/18 1630  BP:  (!) 182/89 (!) 188/100 (!) 169/87  Pulse:  (!) 55 63 (!) 58  Resp:  (!) 29 (!) 30 (!) 22  Temp:  TempSrc:      SpO2: 96% 99% 95% 98%  Weight:      Height:          General: Appears in mild distress due to shortness of breath in bed head elevated.    Eyes:  PERRL, EOMI, normal lids, iris  ENT: grossly normal hearing, lips & tongue, mmm  Neck: no LAD, masses or thyromegaly  Cardiovascular: RRR, no m/r/g. No LE edema.   Respiratory: Diminished breath sounds at the bases bilaterally, no w/r/r. Normal respiratory effort.  Abdomen: soft, ntnd, NABS  Skin: no rash or induration seen on limited exam  Musculoskeletal: 2+ pitting edema up to the knees.    Psychiatric: grossly normal mood and affect, speech fluent and appropriate, AOx3  Neurologic:  CN 2-12 grossly intact, moves all extremities in coordinated fashion, sensation intact  Labs on Admission: I have personally reviewed following labs and imaging studies  CBC: Recent Labs  Lab 11/12/18 1532  WBC 10.2  NEUTROABS 7.8*  HGB 12.2  HCT 41.8  MCV 77.8*  PLT [id number]   Basic Metabolic Panel: Recent Labs  Lab 11/12/18 1532  NA 143  K 4.2  CL 105  CO2 25  GLUCOSE 94  BUN 15  CREATININE 1.06*  CALCIUM 8.9   GFR: Estimated Creatinine Clearance: 82.6 mL/min (A) (by C-G formula based on SCr of 1.06 mg/dL (H)). Liver Function Tests: Recent Labs  Lab 11/12/18 1532  AST 25  ALT 20  ALKPHOS 78  BILITOT 0.9  PROT 7.6  ALBUMIN 4.0   No results for input(s): LIPASE, AMYLASE in the last 168 hours. No results for input(s): AMMONIA in the last 168 hours. Coagulation Profile: No results for input(s): INR, PROTIME in the last 168 hours. Cardiac Enzymes: No results for input(s): CKTOTAL, CKMB, CKMBINDEX, TROPONINI in the last 168  hours. BNP (last 3 results) No results for input(s): PROBNP in the last 8760 hours. HbA1C: No results for input(s): HGBA1C in the last 72 hours. CBG: No results for input(s): GLUCAP in the last 168 hours. Lipid Profile: No results for input(s): CHOL, HDL, LDLCALC, TRIG, CHOLHDL, LDLDIRECT in the last 72 hours. Thyroid Function Tests: No results for input(s): TSH, T4TOTAL, FREET4, T3FREE, THYROIDAB in the last 72 hours. Anemia Panel: No results for input(s): VITAMINB12, FOLATE, FERRITIN, TIBC, IRON, RETICCTPCT in the last 72 hours. Urine analysis: No results found for: COLORURINE, APPEARANCEUR, LABSPEC, PHURINE, GLUCOSEU, HGBUR, BILIRUBINUR, KETONESUR, PROTEINUR, UROBILINOGEN, NITRITE, LEUKOCYTESUR  Creatinine Clearance: Estimated Creatinine Clearance: 82.6 mL/min (A) (by C-G formula based on SCr of 1.06 mg/dL (H)).  Sepsis Labs: @LABRCNTIP (procalcitonin:4,lacticidven:4) ) Recent Results (from the past 240 hour(s))  SARS Coronavirus 2 [facility] order, Performed in [facility] hospital lab) Nasopharyngeal Nasopharyngeal Swab     Status: None   Collection Time: 11/12/18  4:20 PM   Specimen: Nasopharyngeal Swab  Result Value Ref Range Status   SARS Coronavirus 2 NEGATIVE NEGATIVE Final    Comment: (NOTE) If result is NEGATIVE SARS-CoV-2 target nucleic acids are NOT DETECTED. The SARS-CoV-2 RNA is generally detectable in upper and lower  respiratory specimens during the acute phase of infection. The lowest  concentration of SARS-CoV-2 viral copies this assay can detect is 250  copies / mL. A negative result does not preclude SARS-CoV-2 infection  and should not be used as the sole basis for treatment or other  patient management decisions.  A negative result may occur with  improper specimen collection / handling, submission of specimen other  than nasopharyngeal swab,  presence of viral mutation(s) within the  areas targeted by this assay, and inadequate number of viral copies   (<250 copies / mL). A negative result must be combined with clinical  observations, patient history, and epidemiological information. If result is POSITIVE SARS-CoV-2 target nucleic acids are DETECTED. The SARS-CoV-2 RNA is generally detectable in upper and lower  respiratory specimens dur ing the acute phase of infection.  Positive  results are indicative of active infection with SARS-CoV-2.  Clinical  correlation with patient history and other diagnostic information is  necessary to determine patient infection status.  Positive results do  not rule out bacterial infection or co-infection with other viruses. If result is PRESUMPTIVE POSTIVE SARS-CoV-2 nucleic acids MAY BE PRESENT.   A presumptive positive result was obtained on the submitted specimen  and confirmed on repeat testing.  While 2019 novel coronavirus  (SARS-CoV-2) nucleic acids may be present in the submitted sample  additional confirmatory testing may be necessary for epidemiological  and / or clinical management purposes  to differentiate between  SARS-CoV-2 and other Sarbecovirus currently known to infect humans.  If clinically indicated additional testing with an alternate test  methodology [phone]) is advised. The SARS-CoV-2 RNA is generally  detectable in upper and lower respiratory sp ecimens during the acute  phase of infection. The expected result is Negative. Fact Sheet for Patients:  StrictlyIdeas.no Fact Sheet for Healthcare Providers: BankingDealers.co.za This test is not yet approved or cleared by the [place] FDA and has been authorized for detection and/or diagnosis of SARS-CoV-2 by FDA under an Emergency Use Authorization (EUA).  This EUA will remain in effect (meaning this test can be used) for the duration of the COVID-19 declaration under Section 564(b)(1) of the Act, 21 U.S.C. section 360bbb-3(b)(1), unless the authorization is terminated  or revoked sooner. Performed at [facility], [place] [address], [place], [place] [address]      Radiological Exams on Admission: Dg Chest Port 1 View  Result Date: 11/12/2018 CLINICAL DATA:  Shortness of breath for 2 days, history hypertension EXAM: PORTABLE CHEST 1 VIEW COMPARISON:  Portable exam 1514 hours compared to 12/22/2007 FINDINGS: Slight rotation to the RIGHT. Upper normal size of cardiac silhouette. Mediastinal contours and pulmonary vascularity normal. Peribronchial thickening with slight accentuation of perihilar markings, could be related to mild bronchitis or developing perihilar infiltrate. No segmental consolidation, pleural effusion or pneumothorax. Bones demineralized. IMPRESSION: Bronchitic changes with accentuated perihilar markings question related to bronchitis versus developing perihilar infiltrate. Electronically Signed   By: Lavonia Dana M.D.   On: 11/12/2018 15:49    EKG: Sinus rhythm no acute changes  Assessment/Plan Active Problems:   Hypoxia   #1 acute hypoxic respiratory failure multifactorial-she probably does have acute diastolic dysfunction in the setting of morbid obesity hypoventilation, possible obstructive sleep apnea.  I do not see any evidence of pneumonia though chest x-ray shows possible infiltrates versus bronchitis.  Patient denies any fever chills or cough.  She denies any sick contacts.  With the 25 pounds weight gain and increasing lower extremity edema I will treat her with IV Lasix twice a day.  Daily weights and I's and O's.  Her last echo is from 2003 EF was 55 to 60%.  I will repeat an echo this admission.  #2 history of CKD stage II stable  #3 hypertension blood pressure is elevated I will treated with Lasix 40 mg twice a day she takes 20 mg daily at home she is also on Cozaar  and Norvasc which I will hold continue metoprolol.  #4 urinary incontinence continue Ditropan.  #4 morbid obesity patient has no exercise or  activities at home she reports she is afraid to walk alone for fear of falling.   Severity of Illness: The appropriate patient status for this patient is INPATIENT. Inpatient status is judged to be reasonable and necessary in order to provide the required intensity of service to ensure the patient's safety. The patient's presenting symptoms, physical exam findings, and initial radiographic and laboratory data in the context of their chronic comorbidities is felt to place them at high risk for further clinical deterioration. Furthermore, it is not anticipated that the patient will be medically stable for discharge from the hospital within 2 midnights of admission. The following factors support the patient status of inpatient.   " The patient's presenting symptoms include dyspnea on exertion. " The worrisome physical exam findings include 2+ pitting edema up to the knees.,  Hypoxia 83% on room air " The initial radiographic and laboratory data are worrisome because of infiltrates versus bronchitis. " The chronic co-morbidities include morbid obesity hypertension   * I certify that at the point of admission it is my clinical judgment that the patient will require inpatient hospital care spanning beyond 2 midnights from the point of admission due to high intensity of service, high risk for further deterioration and high frequency of surveillance required.*    Estimated body mass index is 63.1 kg/m as calculated from the following:   Height as of this encounter: 5\' 2"  (1.575 m).   Weight as of this encounter: 156.5 kg.   DVT prophylaxis: Lovenox Code Status: Full code Family Communication: None discussed with patient Disposition Plan: Pending clinical improvement Consults called: None Admission status: Inpatient   Georgette Shell MD [facility]  If 7PM-7AM, please contact night-coverage [email or web address] Password [facility]  11/12/2018, 6:17 PM

## 2018-11-12 NOTE — ED Notes (Signed)
ED Provider at bedside. 

## 2018-11-12 NOTE — ED Notes (Signed)
ED TO INPATIENT HANDOFF REPORT  ED Nurse Name and Phone #: [place] G, [phone]  S Name/Age/Gender Renee Durham 63 y.o. female Room/Bed: RESA/RESA  Code Status   Code Status: Full Code  Home/SNF/Other Home Patient oriented to: self, place, time and situation Is this baseline? Yes   Triage Complete: Triage complete  Chief Complaint short of breath   Triage Note No notes on file   Allergies No Known Allergies  Level of Care/Admitting Diagnosis ED Disposition    ED Disposition Condition [place]: [facility] [[id number]]  Level of Care: Telemetry [5]  Admit to tele based on following criteria: Acute CHF  Covid Evaluation: Confirmed COVID Negative  Diagnosis: Hypoxia [id number]  Admitting Physician: Georgette Shell [id number]  Attending Physician: Georgette Shell [id number]  Estimated length of stay: 3 - 4 days  Certification:: I certify this patient will need inpatient services for at least 2 midnights  PT Class (Do Not Modify): Inpatient [101]  PT Acc Code (Do Not Modify): Private [1]       B Medical/Surgery History Past Medical History:  Diagnosis Date  . Anemia   . Arthritis    knees/back  . Chronic kidney disease   . H/O sickle cell trait   . Hypertension   . Obesities, morbid [facility])    Past Surgical History:  Procedure Laterality Date  . BIOPSY  08/08/2017   Procedure: BIOPSY;  Surgeon: Jerene Bears, MD;  Location: [facility];  Service: Gastroenterology;;  . BREAST CYST EXCISION    . COLONOSCOPY N/A 04/21/2014   Procedure: COLONOSCOPY;  Surgeon: Jerene Bears, MD;  Location: [facility];  Service: Gastroenterology;  Laterality: N/A;  . COLONOSCOPY WITH PROPOFOL N/A 08/08/2017   Procedure: COLONOSCOPY WITH PROPOFOL;  Surgeon: Jerene Bears, MD;  Location: [facility];  Service: Gastroenterology;  Laterality: N/A;  . EXTREMITY CYST EXCISION Right    right wrist  . HERNIA REPAIR     umbilical  . KNEE  SURGERY     scope  . POLYPECTOMY    . POLYPECTOMY  08/08/2017   Procedure: POLYPECTOMY;  Surgeon: Jerene Bears, MD;  Location: [facility];  Service: Gastroenterology;;  . uterine ablation       A IV Location/Drains/Wounds Patient Lines/Drains/Airways Status   Active Line/Drains/Airways    Name:   Placement date:   Placement time:   Site:   Days:   Peripheral IV 11/12/18 Right Hand   11/12/18    1600    Hand   less than 1          Intake/Output Last 24 hours  Intake/Output Summary (Last 24 hours) at 11/12/2018 2019 Last data filed at 11/12/2018 1904 Gross per 24 hour  Intake -  Output 1000 ml  Net -1000 ml    Labs/Imaging Results for orders placed or performed during the hospital encounter of 11/12/18 (from the past 48 hour(s))  Comprehensive metabolic panel     Status: Abnormal   Collection Time: 11/12/18  3:32 PM  Result Value Ref Range   Sodium 143 135 - 145 mmol/L   Potassium 4.2 3.5 - 5.1 mmol/L   Chloride 105 98 - 111 mmol/L   CO2 25 22 - 32 mmol/L   Glucose, Bld 94 70 - 99 mg/dL   BUN 15 6 - 20 mg/dL   Creatinine, Ser 1.06 (H) 0.44 - 1.00 mg/dL   Calcium 8.9 8.9 - 10.3 mg/dL   Total Protein 7.6  6.5 - 8.1 g/dL   Albumin 4.0 3.5 - 5.0 g/dL   AST 25 15 - 41 U/L   ALT 20 0 - 44 U/L   Alkaline Phosphatase 78 38 - 126 U/L   Total Bilirubin 0.9 0.3 - 1.2 mg/dL   GFR calc non Af Amer 57 (L) >60 mL/min   GFR calc Af Amer >60 >60 mL/min   Anion gap 13 5 - 15    Comment: Performed at [facility], [place] [address] [place], [place] [address]  CBC WITH DIFFERENTIAL     Status: Abnormal   Collection Time: 11/12/18  3:32 PM  Result Value Ref Range   WBC 10.2 4.0 - 10.5 K/uL   RBC 5.37 (H) 3.87 - 5.11 MIL/uL   Hemoglobin 12.2 12.0 - 15.0 g/dL   HCT 41.8 36.0 - 46.0 %   MCV 77.8 (L) 80.0 - 100.0 fL   MCH 22.7 (L) 26.0 - 34.0 pg   MCHC 29.2 (L) 30.0 - 36.0 g/dL   RDW 19.6 (H) 11.5 - 15.5 %   Platelets 314 150 - 400 K/uL   nRBC 0.0 0.0 - 0.2 %    Neutrophils Relative % 75 %   Neutro Abs 7.8 (H) 1.7 - 7.7 K/uL   Lymphocytes Relative 12 %   Lymphs Abs 1.2 0.7 - 4.0 K/uL   Monocytes Relative 9 %   Monocytes Absolute 0.9 0.1 - 1.0 K/uL   Eosinophils Relative 2 %   Eosinophils Absolute 0.3 0.0 - 0.5 K/uL   Basophils Relative 1 %   Basophils Absolute 0.1 0.0 - 0.1 K/uL   Immature Granulocytes 1 %   Abs Immature Granulocytes 0.05 0.00 - 0.07 K/uL    Comment: Performed at [facility], [place] [address], [place], [place] [address]  Troponin I (High Sensitivity)     Status: None   Collection Time: 11/12/18  3:32 PM  Result Value Ref Range   Troponin I (High Sensitivity) 7 <18 ng/L    Comment: (NOTE) Elevated high sensitivity troponin I (hsTnI) values and significant  changes across serial measurements may suggest ACS but many other  chronic and acute conditions are known to elevate hsTnI results.  Refer to the "Links" section for chest pain algorithms and additional  guidance. Performed at [facility], [place] [address] [place], [place] [address]   Brain natriuretic peptide     Status: Abnormal   Collection Time: 11/12/18  3:33 PM  Result Value Ref Range   B Natriuretic Peptide 136.3 (H) 0.0 - 100.0 pg/mL    Comment: Performed at [facility], [place] [address] [place],  [address]  SARS Coronavirus 2 [facility] order, Performed in [facility] hospital lab) Nasopharyngeal Nasopharyngeal Swab     Status: None   Collection Time: 11/12/18  4:20 PM   Specimen: Nasopharyngeal Swab  Result Value Ref Range   SARS Coronavirus 2 NEGATIVE NEGATIVE    Comment: (NOTE) If result is NEGATIVE SARS-CoV-2 target nucleic acids are NOT DETECTED. The SARS-CoV-2 RNA is generally detectable in upper and lower  respiratory specimens during the acute phase of infection. The lowest  concentration of SARS-CoV-2 viral copies this assay can detect is 250  copies / mL. A negative result does not  preclude SARS-CoV-2 infection  and should not be used as the sole basis for treatment or other  patient management decisions.  A negative result may occur with  improper specimen collection / handling, submission of specimen  other  than nasopharyngeal swab, presence of viral mutation(s) within the  areas targeted by this assay, and inadequate number of viral copies  (<250 copies / mL). A negative result must be combined with clinical  observations, patient history, and epidemiological information. If result is POSITIVE SARS-CoV-2 target nucleic acids are DETECTED. The SARS-CoV-2 RNA is generally detectable in upper and lower  respiratory specimens dur ing the acute phase of infection.  Positive  results are indicative of active infection with SARS-CoV-2.  Clinical  correlation with patient history and other diagnostic information is  necessary to determine patient infection status.  Positive results do  not rule out bacterial infection or co-infection with other viruses. If result is PRESUMPTIVE POSTIVE SARS-CoV-2 nucleic acids MAY BE PRESENT.   A presumptive positive result was obtained on the submitted specimen  and confirmed on repeat testing.  While 2019 novel coronavirus  (SARS-CoV-2) nucleic acids may be present in the submitted sample  additional confirmatory testing may be necessary for epidemiological  and / or clinical management purposes  to differentiate between  SARS-CoV-2 and other Sarbecovirus currently known to infect humans.  If clinically indicated additional testing with an alternate test  methodology [phone]) is advised. The SARS-CoV-2 RNA is generally  detectable in upper and lower respiratory sp ecimens during the acute  phase of infection. The expected result is Negative. Fact Sheet for Patients:  StrictlyIdeas.no Fact Sheet for Healthcare Providers: BankingDealers.co.za This test is not yet approved or cleared by  the [place] FDA and has been authorized for detection and/or diagnosis of SARS-CoV-2 by FDA under an Emergency Use Authorization (EUA).  This EUA will remain in effect (meaning this test can be used) for the duration of the COVID-19 declaration under Section 564(b)(1) of the Act, 21 U.S.C. section 360bbb-3(b)(1), unless the authorization is terminated or revoked sooner. Performed at [facility], [place] [address], [place], [place] [address]   Troponin I (High Sensitivity)     Status: None   Collection Time: 11/12/18  5:56 PM  Result Value Ref Range   Troponin I (High Sensitivity) 6 <18 ng/L    Comment: (NOTE) Elevated high sensitivity troponin I (hsTnI) values and significant  changes across serial measurements may suggest ACS but many other  chronic and acute conditions are known to elevate hsTnI results.  Refer to the "Links" section for chest pain algorithms and additional  guidance. Performed at  [facility], [place] [address] [place], [place] [address]    Dg Chest Port 1 View  Result Date: 11/12/2018 CLINICAL DATA:  Shortness of breath for 2 days, history hypertension EXAM: PORTABLE CHEST 1 VIEW COMPARISON:  Portable exam [id number] hours compared to 12/22/2007 FINDINGS: Slight rotation to the RIGHT. Upper normal size of cardiac silhouette. Mediastinal contours and pulmonary vascularity normal. Peribronchial thickening with slight accentuation of perihilar markings, could be related to mild bronchitis or developing perihilar infiltrate. No segmental consolidation, pleural effusion or pneumothorax. Bones demineralized. IMPRESSION: Bronchitic changes with accentuated perihilar markings question related to bronchitis versus developing perihilar infiltrate. Electronically Signed   By: Lavonia Dana M.D.   On: 11/12/2018 15:49    Pending Labs Unresulted Labs (From admission, onward)    Start     Ordered   11/13/18 0500  Comprehensive metabolic panel   Tomorrow morning,   R     11/12/18 1814   11/13/18 0500  Hemoglobin A1c  Tomorrow morning,   R     11/12/18 1833   11/12/18 1813  HIV Antibody  (Routine Testing)  Once,   STAT     11/12/18 1814          Vitals/Pain Today's Vitals   11/12/18 1730 11/12/18 1800 11/12/18 1830 11/12/18 2000  BP: (!) 167/89 (!) 136/93 (!) 150/89 (!) 151/72  Pulse: (!) 58 (!) 59 (!) 56 (!) 48  Resp: (!) 29 19 (!) 29 (!) 23  Temp:      TempSrc:      SpO2: 96% 94% 94% 95%  Weight:      Height:      PainSc:        Isolation Precautions No active isolations  Medications Medications  enoxaparin (LOVENOX) injection 40 mg (has no administration in time range)  albuterol (VENTOLIN HFA) 108 (90 Base) MCG/ACT inhaler 2 puff (has no administration in time range)  aspirin EC tablet 81 mg (has no administration in time range)  baclofen (LIORESAL) tablet 10 mg (has no administration in time range)  metoprolol succinate (TOPROL-XL) 24 hr tablet 200 mg (has no administration in time range)  oxybutynin (DITROPAN-XL) 24 hr tablet 10 mg (has no administration in time range)  furosemide (LASIX) injection 40 mg (has no administration in time range)  potassium chloride SA (K-DUR) CR tablet 20 mEq (has no administration in time range)  furosemide (LASIX) injection 40 mg (40 mg Intravenous Given 11/12/18 1701)    Mobility walks Low fall risk

## 2018-11-12 NOTE — ED Provider Notes (Signed)
[facility] DEPT Provider Note   CSN: [id number] Arrival date & time: 11/12/18  1359     History   Chief Complaint Chief Complaint  Patient presents with   Shortness of Breath    HPI Renee Durham is a 63 y.o. female with history of CKD, hypertension, morbid obesity presenting today for evaluation of acute onset, progressively worsening shortness of breath for 2.5 weeks.  She reports symptoms began with orthopnea at night and have since progressed to shortness of breath at rest but significantly worsening dyspnea on exertion.  Reports that she cannot ambulate more than a few feet without feeling very winded.  She denies any chest pain, abdominal pain, nausea, vomiting, cough, fever.  She has lower extremity edema for which she takes 20 mg of Lasix twice daily daily.  Has a history of admission for CHF a few years ago and reports this feels very similar.  Went to PCP for evaluation today who noted she was hypoxic and sent her to the ED for further evaluation     The history is provided by the patient.    Past Medical History:  Diagnosis Date   Anemia    Arthritis    knees/back   Chronic kidney disease    H/O sickle cell trait    Hypertension    Obesities, morbid ([place])     Patient Active Problem List   Diagnosis Date Noted   Hypoxia 11/12/2018   Obesity, Class III, BMI 40-49.9 (morbid obesity) ([place]) 11/12/2018   Essential hypertension 01/01/2018   Morbid obesity with BMI of 50.0-59.9, adult ([place]) 01/01/2018   Hx of adenomatous colonic polyps    Benign neoplasm of transverse colon    Stress incontinence 12/28/2016   BMI 60.0-69.9, adult ([place]) 02/11/2016   Peripheral edema 02/11/2016   Screening for colon cancer    Benign neoplasm of ascending colon    Benign neoplasm of rectosigmoid junction    Chronic kidney disease, stage III (moderate) ([place]) 06/07/2013   Pain in joint involving lower leg 01/03/2011   Low back  pain 10/04/2010   Osteoarthritis of knee 09/29/2008   Ganglion cyst of wrist 02/26/2007    Past Surgical History:  Procedure Laterality Date   BIOPSY  08/08/2017   Procedure: BIOPSY;  Surgeon: Jerene Bears, MD;  Location: [facility];  Service: Gastroenterology;;   BREAST CYST EXCISION     COLONOSCOPY N/A 04/21/2014   Procedure: COLONOSCOPY;  Surgeon: Jerene Bears, MD;  Location: [facility];  Service: Gastroenterology;  Laterality: N/A;   COLONOSCOPY WITH PROPOFOL N/A 08/08/2017   Procedure: COLONOSCOPY WITH PROPOFOL;  Surgeon: Jerene Bears, MD;  Location: [facility];  Service: Gastroenterology;  Laterality: N/A;   EXTREMITY CYST EXCISION Right    right wrist   HERNIA REPAIR     umbilical   KNEE SURGERY     scope   POLYPECTOMY     POLYPECTOMY  08/08/2017   Procedure: POLYPECTOMY;  Surgeon: Jerene Bears, MD;  Location: [facility];  Service: Gastroenterology;;   uterine ablation       OB History   No obstetric history on file.      Home Medications    Prior to Admission medications   Medication Sig Start Date End Date Taking? Authorizing Provider  acetaminophen (TYLENOL) 500 MG tablet Take 500-1,000 mg by mouth every 6 (six) hours as needed for mild pain or moderate pain.   Yes [provider]  albuterol (PROVENTIL HFA;VENTOLIN HFA) 108 (  90 Base) MCG/ACT inhaler Inhale 2 puffs into the lungs every 6 (six) hours as needed for wheezing or shortness of breath.  11/07/17  Yes [provider]  amLODipine (NORVASC) 10 MG tablet Take 10 mg by mouth every morning.  09/30/13  Yes [provider]  aspirin EC 81 MG tablet Take 81 mg by mouth daily.  09/08/09  Yes [provider]  baclofen (LIORESAL) 10 MG tablet TAKE 1 TABLET BY MOUTH THREE TIMES A DAY AS NEEDED FOR MUSCLE SPASMS Patient taking differently: Take 10 mg by mouth 3 (three) times daily as needed for muscle spasms.  01/26/18  Yes Hilts, Legrand Como, MD  diclofenac sodium  (VOLTAREN) 1 % GEL Apply 4 g topically 4 (four) times daily as needed. 12/04/17  Yes Hilts, Michael, MD  ferrous sulfate 325 (65 FE) MG tablet Take by mouth.   Yes [provider]  furosemide (LASIX) 20 MG tablet Take 20 mg by mouth daily.  01/26/17  Yes [provider]  losartan (COZAAR) 100 MG tablet Take 100 mg by mouth every morning.  09/30/13  Yes [provider]  methocarbamol (ROBAXIN) 500 MG tablet Take 500 mg by mouth daily as needed for muscle spasms.  11/17/14  Yes [provider]  metoprolol (TOPROL-XL) 200 MG 24 hr tablet Take 200 mg by mouth at bedtime.  10/14/13  Yes [provider]  oxybutynin (DITROPAN-XL) 10 MG 24 hr tablet Take 10 mg by mouth daily. 07/13/17  Yes [provider]  terbinafine (LAMISIL) 250 MG tablet Take 1 tablet (250 mg total) by mouth daily. 09/24/18  Yes Edrick Kins, DPM    Family History Family History  Problem Relation Age of Onset   Breast cancer Sister    Breast cancer Sister    Colon cancer Neg Hx    Esophageal cancer Neg Hx    Rectal cancer Neg Hx    Stomach cancer Neg Hx     Social History Social History   Tobacco Use   Smoking status: Never Smoker   Smokeless tobacco: Never Used  Substance Use Topics   Alcohol use: No    Alcohol/week: 0.0 standard drinks   Drug use: No     Allergies   Patient has no known allergies.   Review of Systems Review of Systems  Constitutional: Negative for chills and fever.  Respiratory: Positive for shortness of breath.   Cardiovascular: Positive for leg swelling. Negative for chest pain.  Gastrointestinal: Negative for abdominal pain, nausea and vomiting.  All other systems reviewed and are negative.    Physical Exam Updated Vital Signs BP (!) 165/76    Pulse (!) 59    Temp 98.3 F (36.8 C) (Oral)    Resp (!) 24    Ht 5\' 2"  (1.575 m)    Wt (!) 156.5 kg    SpO2 98%    BMI 63.10 kg/m   Physical Exam Vitals signs and nursing note  reviewed.  Constitutional:      General: She is not in acute distress.    Appearance: She is well-developed.  HENT:     Head: Normocephalic and atraumatic.  Eyes:     General:        Right eye: No discharge.        Left eye: No discharge.     Conjunctiva/sclera: Conjunctivae normal.  Neck:     Vascular: No JVD.     Trachea: No tracheal deviation.  Cardiovascular:     Rate and  Rhythm: Normal rate and regular rhythm.     Pulses: Normal pulses.     Comments: Pitting edema the bilateral lower extremities.  2+ radial and DP/PT pulses bilaterally Pulmonary:     Effort: Tachypnea present.     Comments: Globally diminished breath sounds but examination is limited due to body habitus.  Bibasilar crackles noted Abdominal:     General: There is no distension.  Musculoskeletal:     Right lower leg: She exhibits no tenderness. Edema present.     Left lower leg: She exhibits no tenderness. Edema present.  Skin:    General: Skin is warm and dry.     Findings: No erythema.  Neurological:     Mental Status: She is alert.  Psychiatric:        Behavior: Behavior normal.      ED Treatments / Results  Labs (all labs ordered are listed, but only abnormal results are displayed) Labs Reviewed  COMPREHENSIVE METABOLIC PANEL - Abnormal; Notable for the following components:      Result Value   Creatinine, Ser 1.06 (*)    GFR calc non Af Amer 57 (*)    All other components within normal limits  CBC WITH DIFFERENTIAL/PLATELET - Abnormal; Notable for the following components:   RBC 5.37 (*)    MCV 77.8 (*)    MCH 22.7 (*)    MCHC 29.2 (*)    RDW 19.6 (*)    Neutro Abs 7.8 (*)    All other components within normal limits  BRAIN NATRIURETIC PEPTIDE - Abnormal; Notable for the following components:   B Natriuretic Peptide 136.3 (*)    All other components within normal limits  SARS CORONAVIRUS 2 (HOSPITAL ORDER, [place] [facility])  HIV ANTIBODY (ROUTINE TESTING W REFLEX)   COMPREHENSIVE METABOLIC PANEL  HEMOGLOBIN A1C  TROPONIN I (HIGH SENSITIVITY)  TROPONIN I (HIGH SENSITIVITY)    EKG EKG Interpretation  Date/Time:  Monday November 12 2018 14:53:51 EDT Ventricular Rate:  60 PR Interval:    QRS Duration: 93 QT Interval:  441 QTC Calculation: 441 R Axis:   50 Text Interpretation:  Sinus rhythm No significant ST segment changes, no T wave inversions No acute STEMI Confirmed by Madalyn Rob [phone]) on 11/12/2018 4:14:34 PM   Radiology Dg Chest Port 1 View  Result Date: 11/12/2018 CLINICAL DATA:  Shortness of breath for 2 days, history hypertension EXAM: PORTABLE CHEST 1 VIEW COMPARISON:  Portable exam 1514 hours compared to 12/22/2007 FINDINGS: Slight rotation to the RIGHT. Upper normal size of cardiac silhouette. Mediastinal contours and pulmonary vascularity normal. Peribronchial thickening with slight accentuation of perihilar markings, could be related to mild bronchitis or developing perihilar infiltrate. No segmental consolidation, pleural effusion or pneumothorax. Bones demineralized. IMPRESSION: Bronchitic changes with accentuated perihilar markings question related to bronchitis versus developing perihilar infiltrate. Electronically Signed   By: Lavonia Dana M.D.   On: 11/12/2018 15:49    Procedures .Critical Care Performed by: Renita Papa, PA-C Authorized by: Renita Papa, PA-C   Critical care provider statement:    Critical care time (minutes):  45   Critical care was necessary to treat or prevent imminent or life-threatening deterioration of the following conditions:  Respiratory failure and cardiac failure   Critical care was time spent personally by me on the following activities:  Discussions with consultants, evaluation of patient's response to treatment, examination of patient, ordering and performing treatments and interventions, ordering and review of laboratory studies, ordering and  review of radiographic studies, pulse  oximetry, re-evaluation of patient's condition, obtaining history from patient or surrogate and review of old charts   (including critical care time)  Medications Ordered in ED Medications  enoxaparin (LOVENOX) injection 40 mg (has no administration in time range)  albuterol (PROVENTIL) (2.5 MG/3ML) 0.083% nebulizer solution 3 mL (has no administration in time range)  aspirin EC tablet 81 mg (has no administration in time range)  baclofen (LIORESAL) tablet 10 mg (has no administration in time range)  metoprolol succinate (TOPROL-XL) 24 hr tablet 200 mg (has no administration in time range)  oxybutynin (DITROPAN-XL) 24 hr tablet 10 mg (has no administration in time range)  furosemide (LASIX) injection 40 mg (has no administration in time range)  potassium chloride SA (K-DUR) CR tablet 20 mEq (has no administration in time range)  furosemide (LASIX) injection 40 mg (40 mg Intravenous Given 11/12/18 1701)     Initial Impression / Assessment and Plan / ED Course  I have reviewed the triage vital signs and the nursing notes.  Pertinent labs & imaging results that were available during my care of the patient were reviewed by me and considered in my medical decision making (see chart for details).        Neema TRAEH MONDS was evaluated in Emergency Department on 11/12/2018 for the symptoms described in the history of present illness. She was evaluated in the context of the global COVID-19 pandemic, which necessitated consideration that the patient might be at risk for infection with the SARS-CoV-2 virus that causes COVID-19. Institutional protocols and algorithms that pertain to the evaluation of patients at risk for COVID-19 are in a state of rapid change based on information released by regulatory bodies including the CDC and federal and state organizations. These policies and algorithms were followed during the patient's care in the ED.  Patient presenting today for evaluation of progressively  worsening shortness of breath for 2-1/2 weeks.  She is afebrile, hypoxic on room air to 83% with improvement on 4 L via nasal cannula, persistently hypertensive in the ED.  Also noted to be tachypneic.  On my assessment she appears to be in no apparent distress, reports she is feeling better on supplemental oxygen.  History and physical examination suggestive of CHF exacerbation with orthopnea, PND, peripheral edema.  Lab work today reviewed by me shows no leukocytosis, no anemia, no metabolic derangements.  She is very mild elevation in her creatinine but BUN is within normal limits.  Her BNP is mildly elevated though this could be falsely low due to her morbid obesity.  Chest x-ray shows bronchitic changes and accentuated perihilar markings which could suggest bronchitis versus developing perihilar infiltrates.  Suspect her hypoxia secondary to heart failure.   EKG shows no significant ST segment changes or T wave inversions, no acute STEMI. Troponin today is negative, doubt ACS/MI.  She was given a dose of IV Lasix in the ED.  COVID test is negative.  Spoke with Dr. Zigmund Daniel with [facility] hospitalist service who agrees to assume care of patient and bring her into the hospital for further evaluation and management.  Final Clinical Impressions(s) / ED Diagnoses   Final diagnoses:  Acute respiratory failure with hypoxia [facility])  Other hypervolemia    ED Discharge Orders    None       Debroah Baller 11/12/18 2146    Lucrezia Starch, MD 11/14/18 [phone]

## 2018-11-13 ENCOUNTER — Encounter (HOSPITAL_COMMUNITY): Payer: Self-pay

## 2018-11-13 ENCOUNTER — Inpatient Hospital Stay (HOSPITAL_COMMUNITY): Payer: 59

## 2018-11-13 DIAGNOSIS — R9431 Abnormal electrocardiogram [ECG] [EKG]: Secondary | ICD-10-CM

## 2018-11-13 DIAGNOSIS — E8779 Other fluid overload: Secondary | ICD-10-CM

## 2018-11-13 DIAGNOSIS — E8770 Fluid overload, unspecified: Secondary | ICD-10-CM

## 2018-11-13 DIAGNOSIS — J9601 Acute respiratory failure with hypoxia: Secondary | ICD-10-CM

## 2018-11-13 LAB — HEMOGLOBIN A1C
Hgb A1c MFr Bld: 6 % — ABNORMAL HIGH (ref 4.8–5.6)
Mean Plasma Glucose: 125.5 mg/dL

## 2018-11-13 LAB — ECHOCARDIOGRAM COMPLETE
Height: 62 in
Weight: 5470.94 oz

## 2018-11-13 LAB — COMPREHENSIVE METABOLIC PANEL
ALT: 17 U/L (ref 0–44)
AST: 15 U/L (ref 15–41)
Albumin: 3.3 g/dL — ABNORMAL LOW (ref 3.5–5.0)
Alkaline Phosphatase: 65 U/L (ref 38–126)
Anion gap: 9 (ref 5–15)
BUN: 15 mg/dL (ref 6–20)
CO2: 29 mmol/L (ref 22–32)
Calcium: 8.4 mg/dL — ABNORMAL LOW (ref 8.9–10.3)
Chloride: 104 mmol/L (ref 98–111)
Creatinine, Ser: 1.06 mg/dL — ABNORMAL HIGH (ref 0.44–1.00)
GFR calc Af Amer: >60 mL/min (ref >60)
GFR calc non Af Amer: 57 mL/min — ABNORMAL LOW (ref >60)
Glucose, Bld: 120 mg/dL — ABNORMAL HIGH (ref 70–99)
Potassium: 3.7 mmol/L (ref 3.5–5.1)
Sodium: 142 mmol/L (ref 135–145)
Total Bilirubin: 0.8 mg/dL (ref 0.3–1.2)
Total Protein: 6.5 g/dL (ref 6.5–8.1)

## 2018-11-13 LAB — HIV ANTIBODY (ROUTINE TESTING W REFLEX): HIV Screen 4th Generation wRfx: NONREACTIVE

## 2018-11-13 MED ORDER — SODIUM CHLORIDE 0.9 % IV SOLN
500.0000 mg | INTRAVENOUS | Status: DC
  Administered 2018-11-13 – 2018-11-14 (×2): 500 mg via INTRAVENOUS
  Filled 2018-11-13 (×3): qty 500

## 2018-11-13 MED ORDER — PERFLUTREN LIPID MICROSPHERE
1.0000 mL | INTRAVENOUS | Status: AC | PRN
  Administered 2018-11-13: 6 mL via INTRAVENOUS
  Filled 2018-11-13: qty 10

## 2018-11-13 MED ORDER — ENOXAPARIN SODIUM 60 MG/0.6ML ~~LOC~~ SOLN: 60.0000 mg | SUBCUTANEOUS | Status: DC

## 2018-11-13 MED ORDER — IOHEXOL 350 MG/ML SOLN
100.0000 mL | Freq: Once | INTRAVENOUS | Status: AC | PRN
  Administered 2018-11-13: 100 mL via INTRAVENOUS

## 2018-11-13 MED ORDER — CALCIUM CARBONATE ANTACID 500 MG PO CHEW: 400.0000 mg | CHEWABLE_TABLET | Freq: Three times a day (TID) | ORAL | Status: DC | PRN

## 2018-11-13 MED ORDER — ENOXAPARIN SODIUM 80 MG/0.8ML ~~LOC~~ SOLN
80.0000 mg | SUBCUTANEOUS | Status: DC
  Administered 2018-11-13 – 2018-11-14 (×2): 80 mg via SUBCUTANEOUS
  Filled 2018-11-13 (×2): qty 0.8

## 2018-11-13 NOTE — Progress Notes (Signed)
Echocardiogram 2D Echocardiogram has been performed.  Oneal Deputy Sterling Mondo 11/13/2018, 3:27 PM

## 2018-11-13 NOTE — Progress Notes (Addendum)
PROGRESS NOTE    Renee Durham  [id number] DOB: 01-22-1956 DOA: 11/12/2018 PCP: Bartholome Bill, MD    Brief Narrative:   63 y.o. female with medical history significant of CKD, obesity morbid, hypertension came in with complaints of worsening shortness of breath gradually progressive for the last few days prior to admission to the hospital.  She has gained 25 pounds in the last year or so.  She checks her weight at home and she called her PCP few months ago and PCP was trying to get her into sleep clinic to get a sleep study to rule out sleep apnea.  Patient lives at home with her family but not very active.  She walks with a walker.  She reports she is afraid to walk alone for risk of falls.  She has dyspnea on exertion and shortness of breath.  Her saturation dropped to 83% on room air in the ER.  She does not have oxygen at home.  She denied any fever chills cough nausea vomiting diarrhea chest pain  or abdominal pain.  She denied any urinary complaints.    ED Course: Patient received Lasix sodium 143 potassium 4.2 BUN 15 creatinine 1.06, BNP was 136 troponin was 7 white count 10.2 hemoglobin 12.2 platelet count 314, COVID negative.  Chest x-ray shows early infiltrates versus bronchitis. Assessment & Plan:   Active Problems:   Hypoxia   Obesity, Class III, BMI 40-49.9 (morbid obesity) (HCC)    #1 acute hypoxic respiratory failure multifactorial-she probably does have acute diastolic dysfunction in the setting of morbid obesity hypoventilation, possible obstructive sleep apnea.  patient continues to be hypoxic at 88 %on RA and 83% on 3 lit..chest xray with bronchitis vs infiltrates .with this high degree of hypoxia will treat her for atypical pneumonia with azithromycin.    Continue IV Lasix she is negative by 1300 cc today.  Monitor renal functions on IV diuretics.   echo 60 to 65%ef..diastolic dysfunction present.. She did not have oxygen prior to admission to hospital  Check CT chest to r/o pe she is at high risk for it with sedentary life style and obesity.shes severly hypoxic with 83 %on 3 LIT of oxygen. She needs continuous in hospital monitoring and treatment till her hypoxia is resolved.she is not a safe discharge due to severe hypoxia and ongoing treatment and investigation which can be done only in the hospital setting..   #2 history of CKD stage II stable  #3 hypertension blood pressure is elevated I will treated with Lasix 40 mg twice a day she takes 20 mg daily at home she is also on Cozaar and Norvasc which I will hold continue metoprolol.  #4 urinary incontinence continue Ditropan.  #4 morbid obesity patient has no exercise or activities at home she reports she is afraid to walk alone for fear of falling.    Estimated body mass index is 62.54 kg/m as calculated from the following:   Height as of this encounter: 5\' 2"  (1.575 m).   Weight as of this encounter: 155.1 kg.  DVT prophylaxis: Lovenox Code Status: Full code Family Communication discussed with patient disposition Plan: Pending improvement in her breathing PT evaluation echocardiogram and requirement of oxygen need   Consultants:   None  Procedures: None Antimicrobials: None  Subjective: Patient complains of not able to lay flat due to increasing dyspnea on exertion and shortness of breath slept with head end of the bed elevated.  No chest pain reported.  Objective: Vitals:  11/12/18 2053 11/12/18 2258 11/13/18 0500 11/13/18 0604  BP: (!) 165/76 (!) 144/70  134/63  Pulse: (!) 59 (!) 51  (!) 58  Resp: (!) 24   20  Temp: 98.3 F (36.8 C)   99.5 F (37.5 C)  TempSrc: Oral   Oral  SpO2: 98% 96%  93%  Weight:   (!) 155.1 kg   Height:        Intake/Output Summary (Last 24 hours) at 11/13/2018 1339 Last data filed at 11/13/2018 0900 Gross per 24 hour  Intake 100 ml  Output 1400 ml  Net -1300 ml   Filed Weights   11/12/18 1449 11/13/18 0500  Weight: (!) 156.5  kg (!) 155.1 kg    Examination:  General exam: Appears calm and comfortable  Respiratory system: Decreased breath sounds at the bases to auscultation. Respiratory effort normal. Cardiovascular system: S1 & S2 heard, RRR. No JVD, murmurs, rubs, gallops or clicks. No pedal edema. Gastrointestinal system: Abdomen is nondistended, soft and nontender. No organomegaly or masses felt. Normal bowel sounds heard. Central nervous system: Alert and oriented. No focal neurological deficits. Extremities: 3+ pitting edema  skin: No rashes, lesions or ulcers Psychiatry: Judgement and insight appear normal. Mood & affect appropriate.     Data Reviewed: I have personally reviewed following labs and imaging studies  CBC: Recent Labs  Lab 11/12/18 1532  WBC 10.2  NEUTROABS 7.8*  HGB 12.2  HCT 41.8  MCV 77.8*  PLT [id number]   Basic Metabolic Panel: Recent Labs  Lab 11/12/18 1532 11/13/18 0348  NA 143 142  K 4.2 3.7  CL 105 104  CO2 25 29  GLUCOSE 94 120*  BUN 15 15  CREATININE 1.06* 1.06*  CALCIUM 8.9 8.4*   GFR: Estimated Creatinine Clearance: 82.1 mL/min (A) (by C-G formula based on SCr of 1.06 mg/dL (H)). Liver Function Tests: Recent Labs  Lab 11/12/18 1532 11/13/18 0348  AST 25 15  ALT 20 17  ALKPHOS 78 65  BILITOT 0.9 0.8  PROT 7.6 6.5  ALBUMIN 4.0 3.3*   No results for input(s): LIPASE, AMYLASE in the last 168 hours. No results for input(s): AMMONIA in the last 168 hours. Coagulation Profile: No results for input(s): INR, PROTIME in the last 168 hours. Cardiac Enzymes: No results for input(s): CKTOTAL, CKMB, CKMBINDEX, TROPONINI in the last 168 hours. BNP (last 3 results) No results for input(s): PROBNP in the last 8760 hours. HbA1C: Recent Labs    11/13/18 0348  HGBA1C 6.0*   CBG: No results for input(s): GLUCAP in the last 168 hours. Lipid Profile: No results for input(s): CHOL, HDL, LDLCALC, TRIG, CHOLHDL, LDLDIRECT in the last 72 hours. Thyroid Function  Tests: No results for input(s): TSH, T4TOTAL, FREET4, T3FREE, THYROIDAB in the last 72 hours. Anemia Panel: No results for input(s): VITAMINB12, FOLATE, FERRITIN, TIBC, IRON, RETICCTPCT in the last 72 hours. Sepsis Labs: No results for input(s): PROCALCITON, LATICACIDVEN in the last 168 hours.  Recent Results (from the past 240 hour(s))  SARS Coronavirus 2 [facility] order, Performed in [facility] hospital lab) Nasopharyngeal Nasopharyngeal Swab     Status: None   Collection Time: 11/12/18  4:20 PM   Specimen: Nasopharyngeal Swab  Result Value Ref Range Status   SARS Coronavirus 2 NEGATIVE NEGATIVE Final    Comment: (NOTE) If result is NEGATIVE SARS-CoV-2 target nucleic acids are NOT DETECTED. The SARS-CoV-2 RNA is generally detectable in upper and lower  respiratory specimens during the acute phase of infection. The lowest  concentration of SARS-CoV-2 viral copies this assay can detect is 250  copies / mL. A negative result does not preclude SARS-CoV-2 infection  and should not be used as the sole basis for treatment or other  patient management decisions.  A negative result may occur with  improper specimen collection / handling, submission of specimen other  than nasopharyngeal swab, presence of viral mutation(s) within the  areas targeted by this assay, and inadequate number of viral copies  (<250 copies / mL). A negative result must be combined with clinical  observations, patient history, and epidemiological information. If result is POSITIVE SARS-CoV-2 target nucleic acids are DETECTED. The SARS-CoV-2 RNA is generally detectable in upper and lower  respiratory specimens dur ing the acute phase of infection.  Positive  results are indicative of active infection with SARS-CoV-2.  Clinical  correlation with patient history and other diagnostic information is  necessary to determine patient infection status.  Positive results do  not rule out bacterial infection or co-infection  with other viruses. If result is PRESUMPTIVE POSTIVE SARS-CoV-2 nucleic acids MAY BE PRESENT.   A presumptive positive result was obtained on the submitted specimen  and confirmed on repeat testing.  While 2019 novel coronavirus  (SARS-CoV-2) nucleic acids may be present in the submitted sample  additional confirmatory testing may be necessary for epidemiological  and / or clinical management purposes  to differentiate between  SARS-CoV-2 and other Sarbecovirus currently known to infect humans.  If clinically indicated additional testing with an alternate test  methodology [phone]) is advised. The SARS-CoV-2 RNA is generally  detectable in upper and lower respiratory sp ecimens during the acute  phase of infection. The expected result is Negative. Fact Sheet for Patients:  StrictlyIdeas.no Fact Sheet for Healthcare Providers: BankingDealers.co.za This test is not yet approved or cleared by the [place] FDA and has been authorized for detection and/or diagnosis of SARS-CoV-2 by FDA under an Emergency Use Authorization (EUA).  This EUA will remain in effect (meaning this test can be used) for the duration of the COVID-19 declaration under Section 564(b)(1) of the Act, 21 U.S.C. section 360bbb-3(b)(1), unless the authorization is terminated or revoked sooner. Performed at [facility], [place] [address], [place], [place] [address]          Radiology Studies: Dg Chest Port 1 View  Result Date: 11/12/2018 CLINICAL DATA:  Shortness of breath for 2 days, history hypertension EXAM: PORTABLE CHEST 1 VIEW COMPARISON:  Portable exam [id number] hours compared to 12/22/2007 FINDINGS: Slight rotation to the RIGHT. Upper normal size of cardiac silhouette. Mediastinal contours and pulmonary vascularity normal. Peribronchial thickening with slight accentuation of perihilar markings, could be related to mild bronchitis or developing  perihilar infiltrate. No segmental consolidation, pleural effusion or pneumothorax. Bones demineralized. IMPRESSION: Bronchitic changes with accentuated perihilar markings question related to bronchitis versus developing perihilar infiltrate. Electronically Signed   By: Lavonia Dana M.D.   On: 11/12/2018 15:49        Scheduled Meds: . aspirin EC  81 mg Oral Daily  . enoxaparin (LOVENOX) injection  80 mg Subcutaneous Q24H  . furosemide  40 mg Intravenous BID  . metoprolol  200 mg Oral QHS  . oxybutynin  10 mg Oral Daily  . potassium chloride  20 mEq Oral BID   Continuous Infusions:   LOS: 1 day        Georgette Shell, MD [facility]  If 7PM-7AM, please contact night-coverage [email or web address] Password [email or web address] 11/13/2018, 1:39 PM

## 2018-11-13 NOTE — Evaluation (Signed)
Physical Therapy Evaluation Patient Details Name: Renee Durham MRN: [id number] DOB: May 02, 1955 Today's Date: 11/13/2018   History of Present Illness  63 yo female admitted with hypoxia. Hx of obesity, CKD  Clinical Impression  On eval, pt required Min assist +2 for mobility. She walked ~115 feet with the bariatric rolling walker. During session: SpO2 88% on RA at rest, 83% on 3L [place] O2, dyspnea 3/4 during ambulation (reading fluctuated so unsure of accuracy). Pt did have some intermittent knee buckling during session. Discussed d/c plan-pt plans to return home with family assisting as needed. Will follow during hospital stay.     Follow Up Recommendations Home health PT;Supervision/Assistance - 24 hour    Equipment Recommendations  (bariatric 4 wheeled rolling walker)    Recommendations for Other Services       Precautions / Restrictions Precautions Precautions: Fall Precaution Comments: monitor O2 Restrictions Weight Bearing Restrictions: No      Mobility  Bed Mobility Overal bed mobility: Needs Assistance Bed Mobility: Sit to Supine       Sit to supine: Mod assist;HOB elevated   General bed mobility comments: Assist for bil LEs onto bed. Increased time. Pt relied heavily on bedrails.  Transfers Overall transfer level: Needs assistance Equipment used: 4-wheeled walker(bari) Transfers: Sit to/from Stand Sit to Stand: Min assist;+2 safety/equipment;From elevated surface         General transfer comment: Assist to rise, stabilize, control descent. Pt prefers to pull up on walker. Knees sometimes buckle.  Ambulation/Gait Ambulation/Gait assistance: Min assist;+2 physical assistance;+2 safety/equipment Gait Distance (Feet): 115 Feet Assistive device: 4-wheeled walker(bari) Gait Pattern/deviations: Step-through pattern;Decreased stride length     General Gait Details: Assist to stabilize pt and bari rollator. Difficulty maintaining pulse ox reading: SpO2 83% on  3L, dyspnea 3/4. Pt prefers to walk while resting bil forearms on handles of walker.   Stairs            Wheelchair Mobility    Modified Rankin (Stroke Patients Only)       Balance Overall balance assessment: Needs assistance         Standing balance support: Bilateral upper extremity supported Standing balance-Leahy Scale: Poor                               Pertinent Vitals/Pain Pain Assessment: No/denies pain    Home Living Family/patient expects to be discharged to:: Private residence Living Arrangements: Other relatives Available Help at Discharge: Family Type of Home: House Home Access: Level entry     Home Layout: One level Home Equipment: Kasandra Knudsen - single point Additional Comments: pt states she is getting a wheelchair    Prior Function Level of Independence: Needs assistance   Gait / Transfers Assistance Needed: uses 2 canes for ambulation           Hand Dominance        Extremity/Trunk Assessment   Upper Extremity Assessment Upper Extremity Assessment: Generalized weakness    Lower Extremity Assessment Lower Extremity Assessment: Generalized weakness    Cervical / Trunk Assessment Cervical / Trunk Assessment: Normal  Communication   Communication: No difficulties  Cognition Arousal/Alertness: Awake/alert Behavior During Therapy: WFL for tasks assessed/performed Overall Cognitive Status: Within Functional Limits for tasks assessed  General Comments      Exercises     Assessment/Plan    PT Assessment Patient needs continued PT services  PT Problem List Decreased strength;Decreased mobility;Decreased range of motion;Decreased activity tolerance;Decreased balance;Decreased knowledge of use of DME;Obesity       PT Treatment Interventions DME instruction;Gait training;Therapeutic exercise;Therapeutic activities;Patient/family education;Balance training;Functional  mobility training    PT Goals (Current goals can be found in the Care Plan section)  Acute Rehab PT Goals Patient Stated Goal: to breathe better. regain PLOF. PT Goal Formulation: With patient Time For Goal Achievement: 11/27/18 Potential to Achieve Goals: Fair    Frequency Min 3X/week   Barriers to discharge        Co-evaluation               AM-PAC PT "6 Clicks" Mobility  Outcome Measure Help needed turning from your back to your side while in a flat bed without using bedrails?: A Lot Help needed moving from lying on your back to sitting on the side of a flat bed without using bedrails?: A Lot Help needed moving to and from a bed to a chair (including a wheelchair)?: A Lot Help needed standing up from a chair using your arms (e.g., wheelchair or bedside chair)?: A Lot Help needed to walk in hospital room?: A Little Help needed climbing 3-5 steps with a railing? : Total 6 Click Score: 12    End of Session Equipment Utilized During Treatment: Oxygen Activity Tolerance: Patient tolerated treatment well Patient left: in bed;with call bell/phone within reach;with bed alarm set   PT Visit Diagnosis: Unsteadiness on feet (R26.81);Difficulty in walking, not elsewhere classified (R26.2);Muscle weakness (generalized) (M62.81)    Time: [id number] PT Time Calculation (min) (ACUTE ONLY): 21 min   Charges:   PT Evaluation $PT Eval Moderate Complexity: [place], PT Acute Rehabilitation Services Pager: [phone] Office: [phone]

## 2018-11-14 DIAGNOSIS — R0602 Shortness of breath: Secondary | ICD-10-CM

## 2018-11-14 LAB — COMPREHENSIVE METABOLIC PANEL
ALT: 16 U/L (ref 0–44)
AST: 13 U/L — ABNORMAL LOW (ref 15–41)
Albumin: 3.4 g/dL — ABNORMAL LOW (ref 3.5–5.0)
Alkaline Phosphatase: 57 U/L (ref 38–126)
Anion gap: 6 (ref 5–15)
BUN: 15 mg/dL (ref 6–20)
CO2: 34 mmol/L — ABNORMAL HIGH (ref 22–32)
Calcium: 8.6 mg/dL — ABNORMAL LOW (ref 8.9–10.3)
Chloride: 104 mmol/L (ref 98–111)
Creatinine, Ser: 1.17 mg/dL — ABNORMAL HIGH (ref 0.44–1.00)
GFR calc Af Amer: 59 mL/min — ABNORMAL LOW (ref >60)
GFR calc non Af Amer: 51 mL/min — ABNORMAL LOW (ref >60)
Glucose, Bld: 110 mg/dL — ABNORMAL HIGH (ref 70–99)
Potassium: 4 mmol/L (ref 3.5–5.1)
Sodium: 144 mmol/L (ref 135–145)
Total Bilirubin: 0.6 mg/dL (ref 0.3–1.2)
Total Protein: 6.7 g/dL (ref 6.5–8.1)

## 2018-11-14 LAB — SARS CORONAVIRUS 2 (TAT 6-24 HRS): SARS Coronavirus 2: NEGATIVE

## 2018-11-14 LAB — BRAIN NATRIURETIC PEPTIDE: B Natriuretic Peptide: 102.3 pg/mL — ABNORMAL HIGH (ref 0.0–100.0)

## 2018-11-14 LAB — PROCALCITONIN: Procalcitonin: <0.1 ng/mL

## 2018-11-14 LAB — C-REACTIVE PROTEIN: CRP: 3.4 mg/dL — ABNORMAL HIGH (ref <1.0)

## 2018-11-14 LAB — MAGNESIUM: Magnesium: 1.9 mg/dL (ref 1.7–2.4)

## 2018-11-14 LAB — D-DIMER, QUANTITATIVE: D-Dimer, Quant: 1.92 ug/mL-FEU — ABNORMAL HIGH (ref 0.00–0.50)

## 2018-11-14 MED ORDER — ACETAMINOPHEN 325 MG PO TABS
650.0000 mg | ORAL_TABLET | ORAL | Status: DC | PRN
  Administered 2018-11-14: 650 mg via ORAL
  Filled 2018-11-14: qty 2

## 2018-11-14 MED ORDER — GLUCAGON HCL RDNA (DIAGNOSTIC) 1 MG IJ SOLR
INTRAMUSCULAR | Status: AC
  Filled 2018-11-14: qty 1

## 2018-11-14 MED ORDER — SODIUM CHLORIDE 0.9 % IV SOLN
2.0000 g | Freq: Every day | INTRAVENOUS | Status: DC
  Administered 2018-11-14: 2 g via INTRAVENOUS
  Filled 2018-11-14 (×2): qty 20
  Filled 2018-11-14: qty 2

## 2018-11-14 NOTE — Progress Notes (Signed)
.  PHARMACY NOTE:  ANTIMICROBIAL RENAL DOSAGE ADJUSTMENT  Current antimicrobial regimen includes a mismatch between antimicrobial dosage and BMI.  As per policy approved by the Pharmacy & Therapeutics and Medical Executive Committees, the antimicrobial dosage will be adjusted accordingly.  Current antimicrobial dosage:  Rocephin 1 Gm IV q24h  Indication: CAP  Renal Function:  Estimated Creatinine Clearance: 73.7 mL/min (A) (by C-G formula based on SCr of 1.17 mg/dL (H)). []      On intermittent HD, scheduled: []      On CRRT    Antimicrobial dosage has been changed to:  Rocephin 2  Gm IV q24h for pt with BMI = 61  Additional comments:   Thank you for allowing pharmacy to be a part of this patient's care.  Dorrene German, [facility] 11/14/2018 6:39 AM

## 2018-11-14 NOTE — Progress Notes (Signed)
Pt's CTA resulted. On call provider Schorr notified. Due to findings being consistent with COVID, new orders placed for airborne/contact precautions, lab work, and repeat COVID test. Pt still requiring 3L of oxygen, has diminished lung sounds, VSS besides temp that spiked to 99.9. This RN will carry out new orders, and will continue to monitor pt closely.

## 2018-11-14 NOTE — Progress Notes (Addendum)
PROGRESS NOTE    Renee Durham  [id number] DOB: [birth year] DOA: 11/12/2018 PCP: Bartholome Bill, MD  Brief Narrative: 63 y.o.femalewith medical history significant ofCKD, obesity morbid, hypertension came in with complaints of worsening shortness of breath gradually progressive for the last few days prior to admission to the hospital. She has gained 25 pounds in the last year or so. She checks her weight at home and she called her PCP few months ago and PCP was trying to get her into sleep clinic to get a sleep study to rule out sleep apnea. Patient lives at home with her family but not very active. She walks with a walker. She reports she is afraid to walk alone for risk of falls. She has dyspnea on exertion and shortness of breath. Her saturation dropped to 83% on room air in the ER. She does not have oxygen at home. She denied any fever chills cough nausea vomiting diarrhea chest pain or abdominal pain. She denied any urinary complaints.    ED Course:Patient received Lasix sodium 143 potassium 4.2 BUN 15 creatinine 1.06, BNP was 136 troponin was 7 white count 10.2 hemoglobin 12.2 platelet count 314, COVID negative. Chest x-ray shows early infiltrates versus bronchitis.  Assessment & Plan:   Active Problems:   Hypoxia   Obesity, Class III, BMI 40-49.9 (morbid obesity) (HCC)   Acute respiratory failure with hypoxia (HCC)   Hypervolemia   #1 acute hypoxic respiratory failure secondary to community-acquired pneumonia.  CT of the chest done yesterday shows groundglass appearance of multiple lobes of the lung with consistent with multilobar pneumonia versus viral pneumonia versus covert pneumonia.  Repeat COVID test is sent out and the results are pending at this time.  We will treat her with Rocephin and azithromycin.  In addition to this she has diastolic dysfunction with morbid obesity and sleep apnea.  CT does not show any evidence of pulmonary embolism.  Continue  Lasix and antibiotics.  Her ambulatory oxygen saturation was 83% on 3 L.  She is too short of breath to even ambulate associated with wheezing and cough.  She likely will need oxygen upon discharge.she is not a safe discharge due to severe hypoxia and ongoing treatment and investigation which can be done only in the hospital setting.. She is on airborne and contact isolation as COVID test was pending.  She continues to struggle with her breathing and continues to complain of shortness of breath and dyspnea on exertion.   #2 history of CKD stage II stable  #3 hypertension continue Lasix restart Norvasc metoprolol and Cozaar.   #4 urinary incontinence continue Ditropan.  #4 morbid obesity patient has no exercise or activities at home she reports she is afraid to walk alone for fear of falling.  DVT prophylaxis: Lovenox Code Status: Full code Family Communication discussed with patient disposition Plan: Pending improvement in her breathing PT evaluation echocardiogram and requirement of oxygen need   Consultants:   None  Procedures: None Antimicrobials: None   Estimated body mass index is 61.77 kg/m as calculated from the following:   Height as of this encounter: 5\' 2"  (1.575 m).   Weight as of this encounter: 153.2 kg.    Subjective: Resting in bed in mild distress as she complains of shortness of breath with dyspnea on exertion and worsening hypoxia with exertion.  Objective: Vitals:   11/13/18 2207 11/14/18 0042 11/14/18 0353 11/14/18 1347  BP: (!) 148/65  (!) 147/67 (!) 150/74  Pulse: 72  65 (!)  59  Resp: 20  20 18   Temp: 99.9 F (37.7 C) 98.5 F (36.9 C) 99.1 F (37.3 C) 98.3 F (36.8 C)  TempSrc: Oral Oral Oral Oral  SpO2: 92%  96% 100%  Weight:   (!) 153.2 kg   Height:        Intake/Output Summary (Last 24 hours) at 11/14/2018 1509 Last data filed at 11/14/2018 0600 Gross per 24 hour  Intake 390 ml  Output 1301 ml  Net -911 ml   Filed Weights    11/12/18 1449 11/13/18 0500 11/14/18 0353  Weight: (!) 156.5 kg (!) 155.1 kg (!) 153.2 kg    Examination:  General exam: Appears calm and comfortable  Respiratory system: Scattered rhonchi in both lung fields.  To auscultation. Respiratory effort normal. Cardiovascular system: S1 & S2 heard, RRR. No JVD, murmurs, rubs, gallops or clicks. No pedal edema. Gastrointestinal system: Abdomen is nondistended, soft and nontender. No organomegaly or masses felt. Normal bowel sounds heard. Central nervous system: Alert and oriented. No focal neurological deficits. Extremities: 2+ pitting edema skin: No rashes, lesions or ulcers Psychiatry: Judgement and insight appear normal. Mood & affect appropriate.     Data Reviewed: I have personally reviewed following labs and imaging studies  CBC: Recent Labs  Lab 11/12/18 1532  WBC 10.2  NEUTROABS 7.8*  HGB 12.2  HCT 41.8  MCV 77.8*  PLT [id number]   Basic Metabolic Panel: Recent Labs  Lab 11/12/18 1532 11/13/18 0348 11/14/18 0136  NA 143 142 144  K 4.2 3.7 4.0  CL 105 104 104  CO2 25 29 34*  GLUCOSE 94 120* 110*  BUN 15 15 15   CREATININE 1.06* 1.06* 1.17*  CALCIUM 8.9 8.4* 8.6*  MG  --   --  1.9   GFR: Estimated Creatinine Clearance: 73.7 mL/min (A) (by C-G formula based on SCr of 1.17 mg/dL (H)). Liver Function Tests: Recent Labs  Lab 11/12/18 1532 11/13/18 0348 11/14/18 0136  AST 25 15 13*  ALT 20 17 16   ALKPHOS 78 65 57  BILITOT 0.9 0.8 0.6  PROT 7.6 6.5 6.7  ALBUMIN 4.0 3.3* 3.4*   No results for input(s): LIPASE, AMYLASE in the last 168 hours. No results for input(s): AMMONIA in the last 168 hours. Coagulation Profile: No results for input(s): INR, PROTIME in the last 168 hours. Cardiac Enzymes: No results for input(s): CKTOTAL, CKMB, CKMBINDEX, TROPONINI in the last 168 hours. BNP (last 3 results) No results for input(s): PROBNP in the last 8760 hours. HbA1C: Recent Labs    11/13/18 0348  HGBA1C 6.0*   CBG: No  results for input(s): GLUCAP in the last 168 hours. Lipid Profile: No results for input(s): CHOL, HDL, LDLCALC, TRIG, CHOLHDL, LDLDIRECT in the last 72 hours. Thyroid Function Tests: No results for input(s): TSH, T4TOTAL, FREET4, T3FREE, THYROIDAB in the last 72 hours. Anemia Panel: No results for input(s): VITAMINB12, FOLATE, FERRITIN, TIBC, IRON, RETICCTPCT in the last 72 hours. Sepsis Labs: Recent Labs  Lab 11/14/18 0136  PROCALCITON <0.10    Recent Results (from the past 240 hour(s))  SARS Coronavirus 2 [facility] order, Performed in [facility] hospital lab) Nasopharyngeal Nasopharyngeal Swab     Status: None   Collection Time: 11/12/18  4:20 PM   Specimen: Nasopharyngeal Swab  Result Value Ref Range Status   SARS Coronavirus 2 NEGATIVE NEGATIVE Final    Comment: (NOTE) If result is NEGATIVE SARS-CoV-2 target nucleic acids are NOT DETECTED. The SARS-CoV-2 RNA is generally detectable in upper and  lower  respiratory specimens during the acute phase of infection. The lowest  concentration of SARS-CoV-2 viral copies this assay can detect is 250  copies / mL. A negative result does not preclude SARS-CoV-2 infection  and should not be used as the sole basis for treatment or other  patient management decisions.  A negative result may occur with  improper specimen collection / handling, submission of specimen other  than nasopharyngeal swab, presence of viral mutation(s) within the  areas targeted by this assay, and inadequate number of viral copies  (<250 copies / mL). A negative result must be combined with clinical  observations, patient history, and epidemiological information. If result is POSITIVE SARS-CoV-2 target nucleic acids are DETECTED. The SARS-CoV-2 RNA is generally detectable in upper and lower  respiratory specimens dur ing the acute phase of infection.  Positive  results are indicative of active infection with SARS-CoV-2.  Clinical  correlation with patient  history and other diagnostic information is  necessary to determine patient infection status.  Positive results do  not rule out bacterial infection or co-infection with other viruses. If result is PRESUMPTIVE POSTIVE SARS-CoV-2 nucleic acids MAY BE PRESENT.   A presumptive positive result was obtained on the submitted specimen  and confirmed on repeat testing.  While 2019 novel coronavirus  (SARS-CoV-2) nucleic acids may be present in the submitted sample  additional confirmatory testing may be necessary for epidemiological  and / or clinical management purposes  to differentiate between  SARS-CoV-2 and other Sarbecovirus currently known to infect humans.  If clinically indicated additional testing with an alternate test  methodology [phone]) is advised. The SARS-CoV-2 RNA is generally  detectable in upper and lower respiratory sp ecimens during the acute  phase of infection. The expected result is Negative. Fact Sheet for Patients:  StrictlyIdeas.no Fact Sheet for Healthcare Providers: BankingDealers.co.za This test is not yet approved or cleared by the [place] FDA and has been authorized for detection and/or diagnosis of SARS-CoV-2 by FDA under an Emergency Use Authorization (EUA).  This EUA will remain in effect (meaning this test can be used) for the duration of the COVID-19 declaration under Section 564(b)(1) of the Act, 21 U.S.C. section 360bbb-3(b)(1), unless the authorization is terminated or revoked sooner. Performed at [facility], [place] [address] [place], [place] [address]   SARS CORONAVIRUS 2 (TAT 6-24 HRS) Nasopharyngeal Nasopharyngeal Swab     Status: None   Collection Time: 11/14/18  1:21 AM   Specimen: Nasopharyngeal Swab  Result Value Ref Range Status   SARS Coronavirus 2 NEGATIVE NEGATIVE Final    Comment: (NOTE) SARS-CoV-2 target nucleic acids are NOT DETECTED. The SARS-CoV-2 RNA is  generally detectable in upper and lower respiratory specimens during the acute phase of infection. Negative results do not preclude SARS-CoV-2 infection, do not rule out co-infections with other pathogens, and should not be used as the sole basis for treatment or other patient management decisions. Negative results must be combined with clinical observations, patient history, and epidemiological information. The expected result is Negative. Fact Sheet for Patients: SugarRoll.be Fact Sheet for Healthcare Providers: https://www.[employer].com/ This test is not yet approved or cleared by the [place] FDA and  has been authorized for detection and/or diagnosis of SARS-CoV-2 by FDA under an Emergency Use Authorization (EUA). This EUA will remain  in effect (meaning this test can be used) for the duration of the COVID-19 declaration under Section 56 4(b)(1) of the Act, 21 U.S.C. section 360bbb-3(b)(1), unless the authorization  is terminated or revoked sooner. Performed at [facility], [place] [address] [place], [place] [address]          Radiology Studies: Dg Chest 1 View  Result Date: 11/13/2018 CLINICAL DATA:  Medical history of CKD and morbid obesity, worsening shortness of breath EXAM: CHEST  1 VIEW COMPARISON:  Radiograph 11/12/2018 FINDINGS: Right anterior oblique orientation. Increasing opacity present in the right perihilar region and lung apex. Central airways thickening is present as well. Background of low lung volumes and atelectasis. Cardiomegaly is noted likely with left atrial enlargement. No visible pneumothorax or effusion. No acute osseous or soft tissue abnormality. IMPRESSION: 1. Increasing opacity in the right lung with airways thickening concerning for bronchopneumonia. 2. Background of atelectasis. 3. Cardiomegaly with left atrial enlargement. Electronically Signed   By: Lovena Le M.D.   On: 11/13/2018 19:41    Ct Angio Chest Pe W Or Wo Contrast  Result Date: 11/13/2018 CLINICAL DATA:  PE suspected, high pretest probability EXAM: CT ANGIOGRAPHY CHEST WITH CONTRAST TECHNIQUE: Multidetector CT imaging of the chest was performed using the standard protocol during bolus administration of intravenous contrast. Multiplanar CT image reconstructions and MIPs were obtained to evaluate the vascular anatomy. CONTRAST:  179mL OMNIPAQUE IOHEXOL 350 MG/ML SOLN COMPARISON:  Same-day radiograph, radiograph 11/12/2018 FINDINGS: Cardiovascular: Slightly suboptimal contrast opacification of pulmonary arteries limiting evaluation to the central and lobar branches. No visible filling defects are evident. More distal evaluation is limited by poor contrast bolus and respiratory motion. The central pulmonary arteries are enlarged. No elevation of the RV-LV ratio (0.8) cardiomegaly with biatrial enlargement is present. Atherosclerotic calcification of the coronary arteries. Aorta is normal caliber with few atheromatous calcifications. Three vessel branching of the arch. Mild tortuosity of the right brachiocephalic vessels. Mediastinum/Nodes: No enlarged mediastinal or axillary lymph nodes. Thyroid gland, trachea, and esophagus demonstrate no significant findings. Lungs/Pleura: Patchy areas of multifocal consolidation and ground-glass opacity most pronounced in the right upper lobe, right lower lobe and left upper lobe. Findings on a background of low volumes and atelectasis with mosaic attenuation likely accentuated by imaging during exhalation. Upper Abdomen: No acute abnormalities present in the visualized portions of the upper abdomen. Musculoskeletal: Slight exaggeration of the thoracic kyphosis. Multilevel degenerative changes are present in the imaged portions of the spine. Multilevel flowing anterior osteophytosis compatible features of diffuse idiopathic skeletal hyperostosis (DISH). No acute osseous abnormality or suspicious osseous  lesion. No suspicious chest wall lesions. Review of the MIP images confirms the above findings. IMPRESSION: 1. No central or lobar pulmonary embolism is seen. More distal evaluation is limited by suboptimal contrast bolus and respiratory motion. 2. Patchy areas of multifocal consolidation and ground-glass opacity most pronounced in the right upper lobe, right lower lobe and left upper lobe, concerning for multifocal pneumonia including atypical viral etiologies (such as COVID-19). 3. Cardiomegaly with biatrial enlargement. Coronary atherosclerosis. 4. Aortic Atherosclerosis (ICD10-I70.0). 5. Features of diffuse idiopathic skeletal hyperostosis. Electronically Signed   By: Lovena Le M.D.   On: 11/13/2018 19:47   Dg Chest Port 1 View  Result Date: 11/12/2018 CLINICAL DATA:  Shortness of breath for 2 days, history hypertension EXAM: PORTABLE CHEST 1 VIEW COMPARISON:  Portable exam 1514 hours compared to 12/22/2007 FINDINGS: Slight rotation to the RIGHT. Upper normal size of cardiac silhouette. Mediastinal contours and pulmonary vascularity normal. Peribronchial thickening with slight accentuation of perihilar markings, could be related to mild bronchitis or developing perihilar infiltrate. No segmental consolidation, pleural effusion or pneumothorax. Bones demineralized. IMPRESSION:  Bronchitic changes with accentuated perihilar markings question related to bronchitis versus developing perihilar infiltrate. Electronically Signed   By: Lavonia Dana M.D.   On: 11/12/2018 15:49        Scheduled Meds:  aspirin EC  81 mg Oral Daily   enoxaparin (LOVENOX) injection  80 mg Subcutaneous Q24H   furosemide  40 mg Intravenous BID   glucagon (human recombinant)       metoprolol  200 mg Oral QHS   oxybutynin  10 mg Oral Daily   potassium chloride  20 mEq Oral BID   Continuous Infusions:  azithromycin 500 mg (11/13/18 2048)   cefTRIAXone (ROCEPHIN)  IV 2 g (11/14/18 0950)     LOS: 2 days      Georgette Shell, MD [facility]  If 7PM-7AM, please contact night-coverage [email or web address] Password [email or web address] 11/14/2018, 3:09 PM

## 2018-11-15 MED ORDER — AMOXICILLIN-POT CLAVULANATE 875-125 MG PO TABS: 1.0000 | ORAL_TABLET | Freq: Two times a day (BID) | ORAL | 0 refills | Status: AC

## 2018-11-15 NOTE — TOC Transition Note (Signed)
Transition of Care [facility]) - CM/SW Discharge Note   Patient Details  Name: Nelle MADDALYN NASEEM MRN: [id number] Date of Birth: [birth year]  [facility] CM/SW Contact:  Wende Neighbors, LCSW Phone Number: 11/15/2018, 12:40 PM   Clinical Narrative:    Meet patient at bedside to discuss discharge plans. Patient agreeable to have oxygen and HHPT at home. Patient stated her daughter will pick her up once discharge. [facility] is able to follow patient at home for HHPT and [employer] will drop oxygen/rolling walker off in the room prior to discharge.    Final next level of care: Home w Home Health Services Barriers to Discharge: No Barriers Identified   Patient Goals and CMS Choice   CMS Medicare.gov Compare Post Acute Care list provided to:: Patient    Discharge Placement                       Discharge Plan and Services In-house Referral: Clinical Social Work              DME Arranged: [place]ford Rile rolling, Oxygen DME Agency: [facility] Date DME Agency Contacted: 11/15/18 Time DME Agency Contacted: 1237 Representative spoke with at DME Agency: zack HH Arranged: PT [place]: [facility] [place] Date [place]: 11/15/18 Time [place]: 1238 Representative spoke with at [place]: [place] ([place]) Interventions     Readmission Risk Interventions No flowsheet data found.

## 2018-11-15 NOTE — Discharge Summary (Signed)
Physician Discharge Summary  Renee Durham [id number] DOB: October 13, 1955 DOA: 11/12/2018  PCP: Bartholome Bill, MD  Admit date: 11/12/2018 Discharge date: 11/15/2018  Admitted From: Home  disposition: Home Recommendations for Outpatient Follow-up:  1. Follow up with PCP in 1-2 weeks 2. Please obtain BMP/CBC in one week 3. Repeat chest x-ray in 4 weeks 4. Follow-up with Dr. Elsworth Soho at the pulmonary clinic  Home Health: PT  equipment/Devices: Bariatric 4 wheeled rolling walker  Discharge Condition stable CODE STATUS full code  diet recommendation: Cardiac Brief/Interim Summary:63 y.o.femalewith medical history significant ofCKD, obesity morbid, hypertension came in with complaints of worsening shortness of breath gradually progressive for the last few days prior to admission to the hospital. She has gained 25 pounds in the last year or so. She checks her weight at home and she called her PCP few months ago and PCP was trying to get her into sleep clinic to get a sleep study to rule out sleep apnea. Patient lives at home with her family but not very active. She walks with a walker. She reports she is afraid to walk alone for risk of falls. She has dyspnea on exertion and shortness of breath. Her saturation dropped to 83% on room air in the ER. She does not have oxygen at home. She denied any fever chills cough nausea vomiting diarrhea chest pain or abdominal pain. She denied any urinary complaints.    ED Course:Patient received Lasix sodium 143 potassium 4.2 BUN 15 creatinine 1.06, BNP was 136 troponin was 7 white count 10.2 hemoglobin 12.2 platelet count 314, COVID negative. Chest x-ray shows early infiltrates versus bronchitis.  Discharge Diagnoses:  Active Problems:   Hypoxia   Obesity, Class III, BMI 40-49.9 (morbid obesity) (HCC)   Acute respiratory failure with hypoxia (HCC)   Hypervolemia   SOB (shortness of breath)  #1 acute hypoxic respiratory failure  secondary to multilobar pneumonia/community-acquired pneumonia.  CT chest showed groundglass appearance of multiple lobes.  COVID test on admission was negative repeat co-test was also negative.  Echo showed diastolic dysfunction with normal ejection fraction.  Patient is under the process of going through sleep apnea work-up through her PCP.  I have treated with Lasix antibiotics IV Rocephin and azithromycin.  Her oxygen saturation did improve.  She was 83% on 3 L and on the day of discharge she was 93% on 3 L.  She probably was hypoxic for a long time.  I have discussed with her to follow-up with her PCP and pulmonary clinic for sleep apnea work-up as well as she is being discharged home on oxygen for the first time.  She did not have oxygen at home prior to admission to hospital.  Repeat chest x-ray in 4 weeks.  #2 history of CKD stable on diuretics  #3 hypertension continue Norvasc metoprolol Cozaar and Lasix.  #4 urinary incontinence continue independent.  #5 morbid obesity patient needs ongoing motivation as an outpatient to lose weight.   Estimated body mass index is 61.77 kg/m as calculated from the following:   Height as of this encounter: 5\' 2"  (1.575 m).   Weight as of this encounter: 153.2 kg.  Discharge Instructions  Discharge Instructions    Call MD for:  difficulty breathing, headache or visual disturbances   Complete by: As directed    Call MD for:  persistant nausea and vomiting   Complete by: As directed    Call MD for:  severe uncontrolled pain   Complete by: As directed  Diet - low sodium heart healthy   Complete by: As directed    For home use only DME oxygen   Complete by: As directed    Length of Need: 6 Months   Mode or (Route): Nasal cannula   Liters per Minute: 3   Frequency: Continuous (stationary and portable oxygen unit needed)   Oxygen conserving device: Yes   Oxygen delivery system: Gas   Increase activity slowly   Complete by: As directed       Allergies as of 11/15/2018   No Known Allergies     Medication List    TAKE these medications   acetaminophen 500 MG tablet Commonly known as: TYLENOL Take 500-1,000 mg by mouth every 6 (six) hours as needed for mild pain or moderate pain.   albuterol 108 (90 Base) MCG/ACT inhaler Commonly known as: VENTOLIN HFA Inhale 2 puffs into the lungs every 6 (six) hours as needed for wheezing or shortness of breath.   amLODipine 10 MG tablet Commonly known as: NORVASC Take 10 mg by mouth every morning.   amoxicillin-clavulanate 875-125 MG tablet Commonly known as: Augmentin Take 1 tablet by mouth 2 (two) times daily for 7 days.   aspirin EC 81 MG tablet Take 81 mg by mouth daily.   baclofen 10 MG tablet Commonly known as: LIORESAL TAKE 1 TABLET BY MOUTH THREE TIMES A DAY AS NEEDED FOR MUSCLE SPASMS What changed: See the new instructions.   diclofenac sodium 1 % Gel Commonly known as: VOLTAREN Apply 4 g topically 4 (four) times daily as needed.   ferrous sulfate 325 (65 FE) MG tablet Take by mouth.   furosemide 20 MG tablet Commonly known as: LASIX Take 20 mg by mouth daily.   losartan 100 MG tablet Commonly known as: COZAAR Take 100 mg by mouth every morning.   methocarbamol 500 MG tablet Commonly known as: ROBAXIN Take 500 mg by mouth daily as needed for muscle spasms.   metoprolol 200 MG 24 hr tablet Commonly known as: TOPROL-XL Take 200 mg by mouth at bedtime.   oxybutynin 10 MG 24 hr tablet Commonly known as: DITROPAN-XL Take 10 mg by mouth daily.   terbinafine 250 MG tablet Commonly known as: LamISIL Take 1 tablet (250 mg total) by mouth daily.            Durable Medical Equipment  (From admission, onward)         Start     Ordered   11/15/18 1047  For home use only DME 4 wheeled rolling walker with seat  (Walkers)  Once    Question:  Patient needs a walker to treat with the following condition  Answer:  CHF (congestive heart failure) ([place])    11/15/18 1047   11/15/18 0000  For home use only DME oxygen    Question Answer Comment  Length of Need 6 Months   Mode or (Route) Nasal cannula   Liters per Minute 3   Frequency Continuous (stationary and portable oxygen unit needed)   Oxygen conserving device Yes   Oxygen delivery system Gas      11/15/18 1049         Follow-up Information    Bartholome Bill, MD Follow up.   Specialty: Family Medicine Contact information: [place] [address] [phone]        Rigoberto Noel, MD Follow up.   Specialty: Pulmonary Disease Contact information: [place]  [place] [address] [phone]          No Known Allergies  Consultations: Discussed with PCCM over the phone  Procedures/Studies: Dg Chest 1 View  Result Date: 11/13/2018 CLINICAL DATA:  Medical history of CKD and morbid obesity, worsening shortness of breath EXAM: CHEST  1 VIEW COMPARISON:  Radiograph 11/12/2018 FINDINGS: Right anterior oblique orientation. Increasing opacity present in the right perihilar region and lung apex. Central airways thickening is present as well. Background of low lung volumes and atelectasis. Cardiomegaly is noted likely with left atrial enlargement. No visible pneumothorax or effusion. No acute osseous or soft tissue abnormality. IMPRESSION: 1. Increasing opacity in the right lung with airways thickening concerning for bronchopneumonia. 2. Background of atelectasis. 3. Cardiomegaly with left atrial enlargement. Electronically Signed   By: Lovena Le M.D.   On: 11/13/2018 19:41   Ct Angio Chest Pe W Or Wo Contrast  Result Date: 11/13/2018 CLINICAL DATA:  PE suspected, high pretest probability EXAM: CT ANGIOGRAPHY CHEST WITH CONTRAST TECHNIQUE: Multidetector CT imaging of the chest was performed using the standard protocol during bolus administration of intravenous contrast. Multiplanar CT image reconstructions and MIPs were  obtained to evaluate the vascular anatomy. CONTRAST:  173mL OMNIPAQUE IOHEXOL 350 MG/ML SOLN COMPARISON:  Same-day radiograph, radiograph 11/12/2018 FINDINGS: Cardiovascular: Slightly suboptimal contrast opacification of pulmonary arteries limiting evaluation to the central and lobar branches. No visible filling defects are evident. More distal evaluation is limited by poor contrast bolus and respiratory motion. The central pulmonary arteries are enlarged. No elevation of the RV-LV ratio (0.8) cardiomegaly with biatrial enlargement is present. Atherosclerotic calcification of the coronary arteries. Aorta is normal caliber with few atheromatous calcifications. Three vessel branching of the arch. Mild tortuosity of the right brachiocephalic vessels. Mediastinum/Nodes: No enlarged mediastinal or axillary lymph nodes. Thyroid gland, trachea, and esophagus demonstrate no significant findings. Lungs/Pleura: Patchy areas of multifocal consolidation and ground-glass opacity most pronounced in the right upper lobe, right lower lobe and left upper lobe. Findings on a background of low volumes and atelectasis with mosaic attenuation likely accentuated by imaging during exhalation. Upper Abdomen: No acute abnormalities present in the visualized portions of the upper abdomen. Musculoskeletal: Slight exaggeration of the thoracic kyphosis. Multilevel degenerative changes are present in the imaged portions of the spine. Multilevel flowing anterior osteophytosis compatible features of diffuse idiopathic skeletal hyperostosis (DISH). No acute osseous abnormality or suspicious osseous lesion. No suspicious chest wall lesions. Review of the MIP images confirms the above findings. IMPRESSION: 1. No central or lobar pulmonary embolism is seen. More distal evaluation is limited by suboptimal contrast bolus and respiratory motion. 2. Patchy areas of multifocal consolidation and ground-glass opacity most pronounced in the right upper lobe,  right lower lobe and left upper lobe, concerning for multifocal pneumonia including atypical viral etiologies (such as COVID-19). 3. Cardiomegaly with biatrial enlargement. Coronary atherosclerosis. 4. Aortic Atherosclerosis (ICD10-I70.0). 5. Features of diffuse idiopathic skeletal hyperostosis. Electronically Signed   By: Lovena Le M.D.   On: 11/13/2018 19:47   Dg Chest Port 1 View  Result Date: 11/12/2018 CLINICAL DATA:  Shortness of breath for 2 days, history hypertension EXAM: PORTABLE CHEST 1 VIEW COMPARISON:  Portable exam 1514 hours compared to 12/22/2007 FINDINGS: Slight rotation to the RIGHT. Upper normal size of cardiac silhouette. Mediastinal contours and pulmonary vascularity normal. Peribronchial thickening with slight accentuation of perihilar markings, could be related to mild bronchitis or developing perihilar infiltrate. No segmental consolidation, pleural effusion or pneumothorax. Bones demineralized. IMPRESSION: Bronchitic changes with accentuated  perihilar markings question related to bronchitis versus developing perihilar infiltrate. Electronically Signed   By: Lavonia Dana M.D.   On: 11/12/2018 15:49    (Echo, Carotid, EGD, Colonoscopy, ERCP)    Subjective: Patient resting in bed in no acute distress she was hesitant to go home as she does not have transportation  Discharge Exam: Vitals:   11/14/18 2104 11/15/18 0659  BP: (!) 123/55 (!) 156/72  Pulse: (!) 58 65  Resp: 20 20  Temp: 99.4 F (37.4 C) 98.8 F (37.1 C)  SpO2: 99% 93%   Vitals:   11/14/18 0353 11/14/18 1347 11/14/18 2104 11/15/18 0659  BP: (!) 147/67 (!) 150/74 (!) 123/55 (!) 156/72  Pulse: 65 (!) 59 (!) 58 65  Resp: 20 18 20 20   Temp: 99.1 F (37.3 C) 98.3 F (36.8 C) 99.4 F (37.4 C) 98.8 F (37.1 C)  TempSrc: Oral Oral Oral Oral  SpO2: 96% 100% 99% 93%  Weight: (!) 153.2 kg     Height:        General: Pt is alert, awake, not in acute distress Cardiovascular: RRR, S1/S2 +, no rubs, no  gallops Respiratory: Scattered rhonchi in both lung fields bilaterally, no wheezing, no rhonchi Abdominal: Soft, NT, ND, bowel sounds + Extremities: no edema, no cyanosis    The results of significant diagnostics from this hospitalization (including imaging, microbiology, ancillary and laboratory) are listed below for reference.     Microbiology: Recent Results (from the past 240 hour(s))  SARS Coronavirus 2 [facility] order, Performed in [facility] hospital lab) Nasopharyngeal Nasopharyngeal Swab     Status: None   Collection Time: 11/12/18  4:20 PM   Specimen: Nasopharyngeal Swab  Result Value Ref Range Status   SARS Coronavirus 2 NEGATIVE NEGATIVE Final    Comment: (NOTE) If result is NEGATIVE SARS-CoV-2 target nucleic acids are NOT DETECTED. The SARS-CoV-2 RNA is generally detectable in upper and lower  respiratory specimens during the acute phase of infection. The lowest  concentration of SARS-CoV-2 viral copies this assay can detect is 250  copies / mL. A negative result does not preclude SARS-CoV-2 infection  and should not be used as the sole basis for treatment or other  patient management decisions.  A negative result may occur with  improper specimen collection / handling, submission of specimen other  than nasopharyngeal swab, presence of viral mutation(s) within the  areas targeted by this assay, and inadequate number of viral copies  (<250 copies / mL). A negative result must be combined with clinical  observations, patient history, and epidemiological information. If result is POSITIVE SARS-CoV-2 target nucleic acids are DETECTED. The SARS-CoV-2 RNA is generally detectable in upper and lower  respiratory specimens dur ing the acute phase of infection.  Positive  results are indicative of active infection with SARS-CoV-2.  Clinical  correlation with patient history and other diagnostic information is  necessary to determine patient infection status.  Positive  results do  not rule out bacterial infection or co-infection with other viruses. If result is PRESUMPTIVE POSTIVE SARS-CoV-2 nucleic acids MAY BE PRESENT.   A presumptive positive result was obtained on the submitted specimen  and confirmed on repeat testing.  While 2019 novel coronavirus  (SARS-CoV-2) nucleic acids may be present in the submitted sample  additional confirmatory testing may be necessary for epidemiological  and / or clinical management purposes  to differentiate between  SARS-CoV-2 and other Sarbecovirus currently known to infect humans.  If clinically indicated additional testing with an  alternate test  methodology [phone]) is advised. The SARS-CoV-2 RNA is generally  detectable in upper and lower respiratory sp ecimens during the acute  phase of infection. The expected result is Negative. Fact Sheet for Patients:  StrictlyIdeas.no Fact Sheet for Healthcare Providers: BankingDealers.co.za This test is not yet approved or cleared by the [place] FDA and has been authorized for detection and/or diagnosis of SARS-CoV-2 by FDA under an Emergency Use Authorization (EUA).  This EUA will remain in effect (meaning this test can be used) for the duration of the COVID-19 declaration under Section 564(b)(1) of the Act, 21 U.S.C. section 360bbb-3(b)(1), unless the authorization is terminated or revoked sooner. Performed at [facility], [place] [address], [place], [place] [address]   SARS CORONAVIRUS 2 (TAT 6-24 HRS) Nasopharyngeal Nasopharyngeal Swab     Status: None   Collection Time: 11/14/18  1:21 AM   Specimen: Nasopharyngeal Swab  Result Value Ref Range Status   SARS Coronavirus 2 NEGATIVE NEGATIVE Final    Comment: (NOTE) SARS-CoV-2 target nucleic acids are NOT DETECTED. The SARS-CoV-2 RNA is generally detectable in upper and lower respiratory specimens during the acute phase of infection.  Negative results do not preclude SARS-CoV-2 infection, do not rule out co-infections with other pathogens, and should not be used as the sole basis for treatment or other patient management decisions. Negative results must be combined with clinical observations, patient history, and epidemiological information. The expected result is Negative. Fact Sheet for Patients: SugarRoll.be Fact Sheet for Healthcare Providers: https://www.woods-mathews.com/ This test is not yet approved or cleared by the [place] FDA and  has been authorized for detection and/or diagnosis of SARS-CoV-2 by FDA under an Emergency Use Authorization (EUA). This EUA will remain  in effect (meaning this test can be used) for the duration of the COVID-19 declaration under Section 56 4(b)(1) of the Act, 21 U.S.C. section 360bbb-3(b)(1), unless the authorization is terminated or revoked sooner. Performed at [facility], [place] [address], [place], [place] [address]      Labs: BNP (last 3 results) Recent Labs    11/12/18 1533 11/14/18 0136  BNP 136.3* 0000000*   Basic Metabolic Panel: Recent Labs  Lab 11/12/18 1532 11/13/18 0348 11/14/18 0136  NA 143 142 144  K 4.2 3.7 4.0  CL 105 104 104  CO2 25 29 34*  GLUCOSE 94 120* 110*  BUN 15 15 15   CREATININE 1.06* 1.06* 1.17*  CALCIUM 8.9 8.4* 8.6*  MG  --   --  1.9   Liver Function Tests: Recent Labs  Lab 11/12/18 1532 11/13/18 0348 11/14/18 0136  AST 25 15 13*  ALT 20 17 16   ALKPHOS 78 65 57  BILITOT 0.9 0.8 0.6  PROT 7.6 6.5 6.7  ALBUMIN 4.0 3.3* 3.4*   No results for input(s): LIPASE, AMYLASE in the last 168 hours. No results for input(s): AMMONIA in the last 168 hours. CBC: Recent Labs  Lab 11/12/18 1532  WBC 10.2  NEUTROABS 7.8*  HGB 12.2  HCT 41.8  MCV 77.8*  PLT 314   Cardiac Enzymes: No results for input(s): CKTOTAL, CKMB, CKMBINDEX, TROPONINI in the last 168 hours. BNP: Invalid  input(s): POCBNP CBG: No results for input(s): GLUCAP in the last 168 hours. D-Dimer Recent Labs    11/14/18 0136  DDIMER 1.92*   Hgb A1c Recent Labs    11/13/18 0348  HGBA1C 6.0*   Lipid Profile No results for input(s): CHOL, HDL, LDLCALC, TRIG, CHOLHDL, LDLDIRECT in the  last 72 hours. Thyroid function studies No results for input(s): TSH, T4TOTAL, T3FREE, THYROIDAB in the last 72 hours.  Invalid input(s): FREET3 Anemia work up No results for input(s): VITAMINB12, FOLATE, FERRITIN, TIBC, IRON, RETICCTPCT in the last 72 hours. Urinalysis No results found for: COLORURINE, APPEARANCEUR, LABSPEC, [place], GLUCOSEU, [place], BILIRUBINUR, KETONESUR, PROTEINUR, UROBILINOGEN, NITRITE, LEUKOCYTESUR Sepsis Labs Invalid input(s): PROCALCITONIN,  WBC,  [facility] Recent Results (from the past 240 hour(s))  SARS Coronavirus 2 [facility] order, Performed in [facility] hospital lab) Nasopharyngeal Nasopharyngeal Swab     Status: None   Collection Time: 11/12/18  4:20 PM   Specimen: Nasopharyngeal Swab  Result Value Ref Range Status   SARS Coronavirus 2 NEGATIVE NEGATIVE Final    Comment: (NOTE) If result is NEGATIVE SARS-CoV-2 target nucleic acids are NOT DETECTED. The SARS-CoV-2 RNA is generally detectable in upper and lower  respiratory specimens during the acute phase of infection. The lowest  concentration of SARS-CoV-2 viral copies this assay can detect is 250  copies / mL. A negative result does not preclude SARS-CoV-2 infection  and should not be used as the sole basis for treatment or other  patient management decisions.  A negative result may occur with  improper specimen collection / handling, submission of specimen other  than nasopharyngeal swab, presence of viral mutation(s) within the  areas targeted by this assay, and inadequate number of viral copies  (<250 copies / mL). A negative result must be combined with clinical  observations, patient history, and  epidemiological information. If result is POSITIVE SARS-CoV-2 target nucleic acids are DETECTED. The SARS-CoV-2 RNA is generally detectable in upper and lower  respiratory specimens dur ing the acute phase of infection.  Positive  results are indicative of active infection with SARS-CoV-2.  Clinical  correlation with patient history and other diagnostic information is  necessary to determine patient infection status.  Positive results do  not rule out bacterial infection or co-infection with other viruses. If result is PRESUMPTIVE POSTIVE SARS-CoV-2 nucleic acids MAY BE PRESENT.   A presumptive positive result was obtained on the submitted specimen  and confirmed on repeat testing.  While 2019 novel coronavirus  (SARS-CoV-2) nucleic acids may be present in the submitted sample  additional confirmatory testing may be necessary for epidemiological  and / or clinical management purposes  to differentiate between  SARS-CoV-2 and other Sarbecovirus currently known to infect humans.  If clinically indicated additional testing with an alternate test  methodology [phone]) is advised. The SARS-CoV-2 RNA is generally  detectable in upper and lower respiratory sp ecimens during the acute  phase of infection. The expected result is Negative. Fact Sheet for Patients:  StrictlyIdeas.no Fact Sheet for Healthcare Providers: BankingDealers.co.za This test is not yet approved or cleared by the [place] FDA and has been authorized for detection and/or diagnosis of SARS-CoV-2 by FDA under an Emergency Use Authorization (EUA).  This EUA will remain in effect (meaning this test can be used) for the duration of the COVID-19 declaration under Section 564(b)(1) of the Act, 21 U.S.C. section 360bbb-3(b)(1), unless the authorization is terminated or revoked sooner. Performed at [facility], [place] [address] [place], [place] [address]    SARS CORONAVIRUS 2 (TAT 6-24 HRS) Nasopharyngeal Nasopharyngeal Swab     Status: None   Collection Time: 11/14/18  1:21 AM   Specimen: Nasopharyngeal Swab  Result Value Ref Range Status   SARS Coronavirus 2 NEGATIVE NEGATIVE Final    Comment: (NOTE) SARS-CoV-2 target nucleic  acids are NOT DETECTED. The SARS-CoV-2 RNA is generally detectable in upper and lower respiratory specimens during the acute phase of infection. Negative results do not preclude SARS-CoV-2 infection, do not rule out co-infections with other pathogens, and should not be used as the sole basis for treatment or other patient management decisions. Negative results must be combined with clinical observations, patient history, and epidemiological information. The expected result is Negative. Fact Sheet for Patients: SugarRoll.be Fact Sheet for Healthcare Providers: https://www.woods-mathews.com/ This test is not yet approved or cleared by the [place] FDA and  has been authorized for detection and/or diagnosis of SARS-CoV-2 by FDA under an Emergency Use Authorization (EUA). This EUA will remain  in effect (meaning this test can be used) for the duration of the COVID-19 declaration under Section 56 4(b)(1) of the Act, 21 U.S.C. section 360bbb-3(b)(1), unless the authorization is terminated or revoked sooner. Performed at [facility], [place] [address], [place], [place] [address]      Time coordinating discharge: 35 minutes  SIGNED:   Georgette Shell, MD  [facility] 11/15/2018, 11:02 AM Pager   If 7PM-7AM, please contact night-coverage [email or web address] Password [email or web address]

## 2018-11-15 NOTE — Progress Notes (Signed)
Physical Therapy Treatment Patient Details Name: Renee Durham MRN: [id number] DOB: [birth year] Today's Date: 11/15/2018    History of Present Illness 63 yo female admitted with hypoxia (diagnosed with acute hypoxic respiratory failure secondary to multilobar pneumonia/community-acquired pneumonia). Hx of obesity, CKD    PT Comments    Pt ambulated in hallway 80 feet and keeps forearms resting on RW.  Pt maintained on 3L O2 [place] and reports no SOB during ambulation today.   Follow Up Recommendations  Home health PT;Supervision/Assistance - 24 hour     Equipment Recommendations  Other (comment)(bariatric 4 wheeled rolling walker)    Recommendations for Other Services       Precautions / Restrictions Precautions Precautions: Fall Precaution Comments: monitor O2 Restrictions Weight Bearing Restrictions: No    Mobility  Bed Mobility               General bed mobility comments: sitting EOB on arrival and departure  Transfers Overall transfer level: Needs assistance Equipment used: Rolling walker (2 wheeled) Transfers: Sit to/from Stand Sit to Stand: Min guard         General transfer comment: increased time and effort, prefers to keep UEs on RW  Ambulation/Gait Ambulation/Gait assistance: Min guard Gait Distance (Feet): 80 Feet Assistive device: Rolling walker (2 wheeled) Gait Pattern/deviations: Step-through pattern;Decreased stride length Gait velocity: decreased   General Gait Details: pt keeps forearms resting on RW, distance to tolerance however reports no dyspnea today, maintained 3L O2 [place] (nursing performed saturation qualification this morning already)   Stairs             Wheelchair Mobility    Modified Rankin (Stroke Patients Only)       Balance                                            Cognition Arousal/Alertness: Awake/alert Behavior During Therapy: WFL for tasks assessed/performed Overall Cognitive Status:  Within Functional Limits for tasks assessed                                        Exercises      General Comments        Pertinent Vitals/Pain Pain Assessment: No/denies pain    Home Living                      Prior Function            PT Goals (current goals can now be found in the care plan section) Progress towards PT goals: Progressing toward goals    Frequency    Min 3X/week      PT Plan Current plan remains appropriate    Co-evaluation              AM-PAC PT "6 Clicks" Mobility   Outcome Measure  Help needed turning from your back to your side while in a flat bed without using bedrails?: A Little Help needed moving from lying on your back to sitting on the side of a flat bed without using bedrails?: A Little Help needed moving to and from a bed to a chair (including a wheelchair)?: A Little Help needed standing up from a chair using your arms (e.g., wheelchair or bedside chair)?: A Little Help needed to walk  in hospital room?: A Little Help needed climbing 3-5 steps with a railing? : A Lot 6 Click Score: 17    End of Session Equipment Utilized During Treatment: Oxygen Activity Tolerance: Patient tolerated treatment well Patient left: in bed;with call bell/phone within reach   PT Visit Diagnosis: Difficulty in walking, not elsewhere classified (R26.2)     Time: [id number] PT Time Calculation (min) (ACUTE ONLY): 17 min  Charges:  $Gait Training: 8-22 mins                     Carmelia Bake, PT, [place] Office: [phone] Pager: [phone]  Trena Platt 11/15/2018, 12:51 PM

## 2018-11-15 NOTE — Progress Notes (Signed)
SATURATION QUALIFICATIONS: (This note is used to comply with regulatory documentation for home oxygen)  Patient Saturations on Room Air at Rest = 87%  Patient Saturations on Room Air while Ambulating N/A  Patient Saturations on 3 Liters of oxygen while Ambulating = 93%  Please briefly explain why patient needs home oxygen: Upon application of 3L [place] patient's oxygen saturation increased from 87% to 93% and maintained throughout ambulating around hospital room.

## 2018-11-26 DIAGNOSIS — J9611 Chronic respiratory failure with hypoxia: Secondary | ICD-10-CM | POA: Insufficient documentation

## 2018-12-27 ENCOUNTER — Other Ambulatory Visit: Payer: Self-pay | Admitting: Podiatry

## 2019-01-30 ENCOUNTER — Other Ambulatory Visit: Payer: Self-pay | Admitting: Podiatry

## 2019-01-30 ENCOUNTER — Ambulatory Visit: Payer: 59 | Admitting: Podiatry

## 2019-01-30 ENCOUNTER — Other Ambulatory Visit: Payer: Self-pay

## 2019-01-30 DIAGNOSIS — B351 Tinea unguium: Secondary | ICD-10-CM

## 2019-01-30 DIAGNOSIS — M79676 Pain in unspecified toe(s): Secondary | ICD-10-CM | POA: Diagnosis not present

## 2019-02-04 NOTE — Progress Notes (Signed)
   SUBJECTIVE Patient presents to office today complaining of elongated, thickened nails that cause pain while ambulating in shoes. She is unable to trim her own nails. Patient is here for further evaluation and treatment.  Past Medical History:  Diagnosis Date  . Anemia   . Arthritis    knees/back  . Chronic kidney disease   . H/O sickle cell trait   . Hypertension   . Obesities, morbid ([place])     OBJECTIVE General Patient is awake, alert, and oriented x 3 and in no acute distress. Derm Skin is dry and supple bilateral. Negative open lesions or macerations. Remaining integument unremarkable. Nails are tender, long, thickened and dystrophic with subungual debris, consistent with onychomycosis, 1-5 bilateral. No signs of infection noted. Vasc  DP and PT pedal pulses palpable bilaterally. Temperature gradient within normal limits.  Neuro Epicritic and protective threshold sensation grossly intact bilaterally.  Musculoskeletal Exam No symptomatic pedal deformities noted bilateral. Muscular strength within normal limits.  ASSESSMENT 1. Onychodystrophic nails 1-5 bilateral with hyperkeratosis of nails.  2. Onychomycosis of nail due to dermatophyte bilateral 3. Pain in foot bilateral  PLAN OF CARE 1. Patient evaluated today.  2. Instructed to maintain good pedal hygiene and foot care.  3. Mechanical debridement of nails 1-5 bilaterally performed using a nail nipper. Filed with dremel without incident.  4. Return to clinic in 3 mos.    Edrick Kins, DPM [facility]  Dr. Edrick Kins, [address]                                        [place], [place] [address]                Office [phone]  Fax [phone]

## 2019-05-01 ENCOUNTER — Other Ambulatory Visit: Payer: Self-pay

## 2019-05-01 ENCOUNTER — Ambulatory Visit (INDEPENDENT_AMBULATORY_CARE_PROVIDER_SITE_OTHER): Payer: 59 | Admitting: Podiatry

## 2019-05-01 ENCOUNTER — Encounter: Payer: Self-pay | Admitting: Podiatry

## 2019-05-01 DIAGNOSIS — B351 Tinea unguium: Secondary | ICD-10-CM

## 2019-05-01 DIAGNOSIS — M79676 Pain in unspecified toe(s): Secondary | ICD-10-CM

## 2019-05-01 NOTE — Patient Instructions (Signed)

## 2019-05-06 NOTE — Progress Notes (Signed)
Subjective: Renee Durham presents today for follow up of painful mycotic nails b/l that are difficult to trim. Pain interferes with ambulation. Aggravating factors include wearing enclosed shoe gear. Pain is relieved with periodic professional debridement.   No Known Allergies   Objective: There were no vitals filed for this visit.  Pt 64 y.o. year old AA female  in NAD. AAO x 3.   Vascular Examination:  Capillary refill time to digits immediate b/l. Palpable DP pulses b/l. Palpable PT pulses b/l. Pedal hair sparse b/l. Skin temperature gradient within normal limits b/l.  Dermatological Examination: Pedal skin with normal turgor, texture and tone bilaterally. No open wounds bilaterally. No interdigital macerations bilaterally. Toenails 1-5 b/l elongated, dystrophic, thickened, crumbly with subungual debris and tenderness to dorsal palpation.  Musculoskeletal: Normal muscle strength 5/5 to all lower extremity muscle groups bilaterally, no gross bony deformities bilaterally and no pain crepitus or joint limitation noted with ROM b/l  Neurological: Protective sensation intact 5/5 intact bilaterally with 10g monofilament b/l Vibratory sensation intact b/l  Assessment: 1. Pain due to onychomycosis of toenail    Plan: -Toenails 1-5 b/l were debrided in length and girth with sterile nail nippers and dremel without iatrogenic bleeding.  -Patient to continue soft, supportive shoe gear daily. -Patient to report any pedal injuries to medical professional immediately. -Patient/POA to call should there be question/concern in the interim.  Return in about 3 months (around 08/01/2019) for nail trim.

## 2019-07-31 ENCOUNTER — Ambulatory Visit: Payer: 59 | Admitting: Podiatry

## 2019-07-31 ENCOUNTER — Other Ambulatory Visit: Payer: Self-pay

## 2019-07-31 ENCOUNTER — Encounter: Payer: Self-pay | Admitting: Podiatry

## 2019-07-31 DIAGNOSIS — B351 Tinea unguium: Secondary | ICD-10-CM | POA: Diagnosis not present

## 2019-07-31 DIAGNOSIS — L84 Corns and callosities: Secondary | ICD-10-CM

## 2019-07-31 DIAGNOSIS — M79671 Pain in right foot: Secondary | ICD-10-CM | POA: Diagnosis not present

## 2019-07-31 DIAGNOSIS — M79676 Pain in unspecified toe(s): Secondary | ICD-10-CM | POA: Diagnosis not present

## 2019-07-31 DIAGNOSIS — M79672 Pain in left foot: Secondary | ICD-10-CM | POA: Diagnosis not present

## 2019-08-10 NOTE — Progress Notes (Signed)
Subjective: Renee Durham is a pleasant 64 y.o. female patient seen today painful callus(es) b/l heels and painful mycotic toenails b/l that are difficult to trim. Pain interferes with ambulation. Aggravating factors include wearing enclosed shoe gear. Pain is relieved with periodic professional debridement.  Past Medical History:  Diagnosis Date   Anemia    Arthritis    knees/back   Chronic kidney disease    H/O sickle cell trait    Hypertension    Obesities, morbid ([place])     Patient Active Problem List   Diagnosis Date Noted   Chronic respiratory failure with hypoxia ([place]) 11/26/2018   SOB (shortness of breath)    Acute respiratory failure with hypoxia ([facility])    Hypervolemia    Hypoxia 11/12/2018   Obesity, Class III, BMI 40-49.9 (morbid obesity) ([place]) 11/12/2018   Essential hypertension 01/01/2018   Morbid obesity with BMI of 50.0-59.9, adult ([place]) 01/01/2018   Hx of adenomatous colonic polyps    Benign neoplasm of transverse colon    Stress incontinence 12/28/2016   BMI 60.0-69.9, adult ([place]) 02/11/2016   Peripheral edema 02/11/2016   Screening for colon cancer    Benign neoplasm of ascending colon    Benign neoplasm of rectosigmoid junction    Chronic kidney disease, stage III (moderate) 06/07/2013   Pain in joint involving lower leg 01/03/2011   Low back pain 10/04/2010   Osteoarthritis of knee 09/29/2008   Ganglion cyst of wrist 02/26/2007    Current Outpatient Medications on File Prior to Visit  Medication Sig Dispense Refill   acetaminophen (TYLENOL) 500 MG tablet Take 500-1,000 mg by mouth every 6 (six) hours as needed for mild pain or moderate pain.     albuterol (PROVENTIL HFA;VENTOLIN HFA) 108 (90 Base) MCG/ACT inhaler Inhale 2 puffs into the lungs every 6 (six) hours as needed for wheezing or shortness of breath.      amLODipine (NORVASC) 10 MG tablet Take 10 mg by mouth every morning.      aspirin EC 81 MG tablet Take 81 mg  by mouth daily.      baclofen (LIORESAL) 10 MG tablet TAKE 1 TABLET BY MOUTH THREE TIMES A DAY AS NEEDED FOR MUSCLE SPASMS (Patient taking differently: Take 10 mg by mouth 3 (three) times daily as needed for muscle spasms. ) 60 tablet 1   diclofenac sodium (VOLTAREN) 1 % GEL Apply 4 g topically 4 (four) times daily as needed. 500 g 6   ferrous sulfate 325 (65 FE) MG tablet Take by mouth.     furosemide (LASIX) 20 MG tablet Take 20 mg by mouth daily.      losartan (COZAAR) 100 MG tablet Take 100 mg by mouth every morning.      methocarbamol (ROBAXIN) 500 MG tablet Take 500 mg by mouth daily as needed for muscle spasms.      metoprolol (TOPROL-XL) 200 MG 24 hr tablet Take 200 mg by mouth at bedtime.      oxybutynin (DITROPAN-XL) 10 MG 24 hr tablet Take 10 mg by mouth daily.  10   terbinafine (LAMISIL) 250 MG tablet Take 1 tablet (250 mg total) by mouth daily. 90 tablet 0   No current facility-administered medications on file prior to visit.    No Known Allergies  Objective: Physical Exam  General: Renee Durham is a pleasant 64 y.o. African American female, morbidly obese in NAD. AAO x 3.   Vascular:  Neurovascular status unchanged b/l lower extremities. Capillary refill time to  digits immediate b/l. Palpable pedal pulses b/l LE. Pedal hair sparse. Lower extremity skin temperature gradient within normal limits.  Dermatological:  Pedal skin with normal turgor, texture and tone bilaterally. No open wounds bilaterally. No interdigital macerations bilaterally. Toenails 1-5 b/l elongated, discolored, dystrophic, thickened, crumbly with subungual debris and tenderness to dorsal palpation. Hyperkeratotic lesion(s) plantar aspect b/l heel pads.  No erythema, no edema, no drainage, no flocculence.  Musculoskeletal:  Normal muscle strength 5/5 to all lower extremity muscle groups bilaterally. No pain crepitus or joint limitation noted with ROM b/l. No gross bony deformities  bilaterally.  Neurological:  Protective sensation intact 5/5 intact bilaterally with 10g monofilament b/l. Vibratory sensation intact b/l. Proprioception intact bilaterally. Babinski reflex negative b/l.  Assessment and Plan:  1. Pain due to onychomycosis of toenail   2. Callus   3. Pain in both feet    -Examined patient. -Toenails 1-5 b/l were debrided in length and girth with sterile nail nippers and dremel without iatrogenic bleeding.  -Callus(es) plantar aspect b/l heel pads pared utilizing sterile scalpel blade without complication or incident. Total number debrided =2. -Patient to report any pedal injuries to medical professional immediately. -Patient to continue soft, supportive shoe gear daily. -Patient/POA to call should there be question/concern in the interim.  Return in about 3 months (around 10/31/2019) for nail trim.  Marzetta Board, DPM

## 2019-11-04 ENCOUNTER — Ambulatory Visit: Payer: 59 | Admitting: Podiatry

## 2019-11-04 ENCOUNTER — Other Ambulatory Visit: Payer: Self-pay

## 2019-11-04 DIAGNOSIS — M79671 Pain in right foot: Secondary | ICD-10-CM

## 2019-11-04 DIAGNOSIS — L84 Corns and callosities: Secondary | ICD-10-CM

## 2019-11-04 DIAGNOSIS — B351 Tinea unguium: Secondary | ICD-10-CM

## 2019-11-04 DIAGNOSIS — M79676 Pain in unspecified toe(s): Secondary | ICD-10-CM

## 2019-11-04 DIAGNOSIS — M79672 Pain in left foot: Secondary | ICD-10-CM | POA: Diagnosis not present

## 2019-11-07 ENCOUNTER — Encounter: Payer: Self-pay | Admitting: Podiatry

## 2019-11-07 NOTE — Progress Notes (Signed)
Subjective: Renee Durham is a pleasant 64 y.o. female patient seen today painful callus(es) b/l heels and painful mycotic toenails b/l that are difficult to trim. Pain interferes with ambulation. Aggravating factors include wearing enclosed shoe gear. Pain is relieved with periodic professional debridement.   She voices no new pedal concerns on today's visit.  Past Medical History:  Diagnosis Date  . Anemia   . Arthritis    knees/back  . Chronic kidney disease   . H/O sickle cell trait   . Hypertension   . Obesities, morbid [facility])     Patient Active Problem List   Diagnosis Date Noted  . Chronic respiratory failure with hypoxia ([place]) 11/26/2018  . SOB (shortness of breath)   . Acute respiratory failure with hypoxia ([place])   . Hypervolemia   . Hypoxia 11/12/2018  . Obesity, Class III, BMI 40-49.9 (morbid obesity) ([place]) 11/12/2018  . Essential hypertension 01/01/2018  . Morbid obesity with BMI of 50.0-59.9, adult ([place]) 01/01/2018  . Hx of adenomatous colonic polyps   . Benign neoplasm of transverse colon   . Stress incontinence 12/28/2016  . BMI 60.0-69.9, adult ([place]) 02/11/2016  . Peripheral edema 02/11/2016  . Screening for colon cancer   . Benign neoplasm of ascending colon   . Benign neoplasm of rectosigmoid junction   . Chronic kidney disease, stage III (moderate) 06/07/2013  . Pain in joint involving lower leg 01/03/2011  . Low back pain 10/04/2010  . Osteoarthritis of knee 09/29/2008  . Ganglion cyst of wrist 02/26/2007    Current Outpatient Medications on File Prior to Visit  Medication Sig Dispense Refill  . acetaminophen (TYLENOL) 500 MG tablet Take 500-1,000 mg by mouth every 6 (six) hours as needed for mild pain or moderate pain.    Marland Kitchen albuterol (PROVENTIL HFA;VENTOLIN HFA) 108 (90 Base) MCG/ACT inhaler Inhale 2 puffs into the lungs every 6 (six) hours as needed for wheezing or shortness of breath.     Marland Kitchen amLODipine (NORVASC) 10 MG tablet Take 10 mg by mouth  every morning.     Marland Kitchen aspirin EC 81 MG tablet Take 81 mg by mouth daily.     . baclofen (LIORESAL) 10 MG tablet TAKE 1 TABLET BY MOUTH THREE TIMES A DAY AS NEEDED FOR MUSCLE SPASMS (Patient taking differently: Take 10 mg by mouth 3 (three) times daily as needed for muscle spasms. ) 60 tablet 1  . diclofenac sodium (VOLTAREN) 1 % GEL Apply 4 g topically 4 (four) times daily as needed. 500 g 6  . ferrous sulfate 325 (65 FE) MG tablet Take by mouth.    . furosemide (LASIX) 20 MG tablet Take 20 mg by mouth daily.     Marland Kitchen ketoconazole (NIZORAL) 2 % shampoo Apply topically.    Marland Kitchen losartan (COZAAR) 100 MG tablet Take 100 mg by mouth every morning.     . methocarbamol (ROBAXIN) 500 MG tablet Take 500 mg by mouth daily as needed for muscle spasms.     . metoprolol (TOPROL-XL) 200 MG 24 hr tablet Take 200 mg by mouth at bedtime.     Marland Kitchen oxybutynin (DITROPAN-XL) 10 MG 24 hr tablet Take 10 mg by mouth daily.  10  . terbinafine (LAMISIL) 250 MG tablet Take 1 tablet (250 mg total) by mouth daily. 90 tablet 0   No current facility-administered medications on file prior to visit.    No Known Allergies  Objective: Physical Exam  General: Renee Durham is a pleasant 64 y.o. African American  female, morbidly obese in NAD. AAO x 3.   Vascular:  Neurovascular status unchanged b/l lower extremities. Capillary refill time to digits immediate b/l. Palpable pedal pulses b/l LE. Pedal hair sparse. Lower extremity skin temperature gradient within normal limits.  Dermatological:  Pedal skin with normal turgor, texture and tone bilaterally. No open wounds bilaterally. No interdigital macerations bilaterally. Toenails 1-5 b/l elongated, discolored, dystrophic, thickened, crumbly with subungual debris and tenderness to dorsal palpation. Hyperkeratotic lesion(s) plantar aspect b/l heel pads and plantarlateral aspect of right foot.  No erythema, no edema, no drainage, no flocculence.  Musculoskeletal:  Normal muscle  strength 5/5 to all lower extremity muscle groups bilaterally. No pain crepitus or joint limitation noted with ROM b/l. No gross bony deformities bilaterally.  Neurological:  Protective sensation intact 5/5 intact bilaterally with 10g monofilament b/l. Vibratory sensation intact b/l. Proprioception intact bilaterally. Babinski reflex negative b/l.  Assessment and Plan:  1. Pain due to onychomycosis of toenail   2. Callus   3. Pain in both feet    -Examined patient. -Toenails 1-5 b/l were debrided in length and girth with sterile nail nippers and dremel without iatrogenic bleeding.  -Callus(es) plantar aspect b/l heel pads and plantarlateral aspect of right foot pared utilizing sterile scalpel blade without complication or incident. Total number debrided =3. -Patient to report any pedal injuries to medical professional immediately. -Patient to continue soft, supportive shoe gear daily. -Patient/POA to call should there be question/concern in the interim.  Return in about 3 months (around 02/03/2020).  Marzetta Board, DPM

## 2020-02-04 ENCOUNTER — Encounter: Payer: Self-pay | Admitting: Podiatry

## 2020-02-04 ENCOUNTER — Other Ambulatory Visit: Payer: Self-pay

## 2020-02-04 ENCOUNTER — Ambulatory Visit: Payer: 59 | Admitting: Podiatry

## 2020-02-04 DIAGNOSIS — L84 Corns and callosities: Secondary | ICD-10-CM | POA: Diagnosis not present

## 2020-02-04 DIAGNOSIS — L600 Ingrowing nail: Secondary | ICD-10-CM

## 2020-02-04 DIAGNOSIS — M79672 Pain in left foot: Secondary | ICD-10-CM | POA: Diagnosis not present

## 2020-02-04 DIAGNOSIS — M79671 Pain in right foot: Secondary | ICD-10-CM | POA: Diagnosis not present

## 2020-02-04 DIAGNOSIS — M79676 Pain in unspecified toe(s): Secondary | ICD-10-CM

## 2020-02-04 DIAGNOSIS — B351 Tinea unguium: Secondary | ICD-10-CM | POA: Diagnosis not present

## 2020-02-10 NOTE — Progress Notes (Signed)
Subjective: Renee Durham is a pleasant 64 y.o. female patient seen today painful callus(es) b/l heels and painful mycotic toenails b/l that are difficult to trim. Pain interferes with ambulation. Aggravating factors include wearing enclosed shoe gear. Pain is relieved with periodic professional debridement.   She voices no new pedal concerns on today's visit.  Past Medical History:  Diagnosis Date   Anemia    Arthritis    knees/back   Chronic kidney disease    H/O sickle cell trait    Hypertension    Obesities, morbid ([facility])     Patient Active Problem List   Diagnosis Date Noted   Chronic respiratory failure with hypoxia ([facility]) 11/26/2018   SOB (shortness of breath)    Acute respiratory failure with hypoxia ([facility])    Hypervolemia    Hypoxia 11/12/2018   Obesity, Class III, BMI 40-49.9 (morbid obesity) ([facility]) 11/12/2018   Essential hypertension 01/01/2018   Morbid obesity with BMI of 50.0-59.9, adult ([facility]) 01/01/2018   Hx of adenomatous colonic polyps    Benign neoplasm of transverse colon    Stress incontinence 12/28/2016   BMI 60.0-69.9, adult ([facility]) 02/11/2016   Peripheral edema 02/11/2016   Screening for colon cancer    Benign neoplasm of ascending colon    Benign neoplasm of rectosigmoid junction    Chronic kidney disease, stage III (moderate) ([facility]) 06/07/2013   Pain in joint involving lower leg 01/03/2011   Low back pain 10/04/2010   Osteoarthritis of knee 09/29/2008   Ganglion cyst of wrist 02/26/2007    Current Outpatient Medications on File Prior to Visit  Medication Sig Dispense Refill   acetaminophen (TYLENOL) 500 MG tablet Take 500-1,000 mg by mouth every 6 (six) hours as needed for mild pain or moderate pain.     albuterol (PROVENTIL HFA;VENTOLIN HFA) 108 (90 Base) MCG/ACT inhaler Inhale 2 puffs into the lungs every 6 (six) hours as needed for wheezing or shortness of breath.      amLODipine (NORVASC) 10 MG tablet Take 10 mg by  mouth every morning.      aspirin EC 81 MG tablet Take 81 mg by mouth daily.      baclofen (LIORESAL) 10 MG tablet TAKE 1 TABLET BY MOUTH THREE TIMES A DAY AS NEEDED FOR MUSCLE SPASMS (Patient taking differently: Take 10 mg by mouth 3 (three) times daily as needed for muscle spasms.) 60 tablet 1   diclofenac sodium (VOLTAREN) 1 % GEL Apply 4 g topically 4 (four) times daily as needed. 500 g 6   ferrous sulfate 325 (65 FE) MG tablet Take by mouth.     furosemide (LASIX) 20 MG tablet Take 20 mg by mouth daily.      ketoconazole (NIZORAL) 2 % shampoo Apply topically.     losartan (COZAAR) 100 MG tablet Take 100 mg by mouth every morning.      methocarbamol (ROBAXIN) 500 MG tablet Take 500 mg by mouth daily as needed for muscle spasms.      metoprolol (TOPROL-XL) 200 MG 24 hr tablet Take 200 mg by mouth at bedtime.      oxybutynin (DITROPAN-XL) 10 MG 24 hr tablet Take 10 mg by mouth daily.  10   terbinafine (LAMISIL) 250 MG tablet Take 1 tablet (250 mg total) by mouth daily. 90 tablet 0   No current facility-administered medications on file prior to visit.    No Known Allergies  Objective: Physical Exam  General: Renee Durham is a pleasant 64 y.o. African American  female, morbidly obese in NAD. AAO x 3.   Vascular:  Neurovascular status unchanged b/l lower extremities. Capillary refill time to digits immediate b/l. Palpable pedal pulses b/l LE. Pedal hair sparse. Lower extremity skin temperature gradient within normal limits.  Dermatological:  Pedal skin with normal turgor, texture and tone bilaterally. No open wounds bilaterally. No interdigital macerations bilaterally. Toenails 1-5 b/l elongated, discolored, dystrophic, thickened, crumbly with subungual debris and tenderness to dorsal palpation.   Incurvated nailplate left great toe medial border(s) with tenderness to palpation. No erythema, no edema, no drainage noted.  Hyperkeratotic lesion(s) plantar aspect b/l heel  pads and plantarlateral aspect of right foot.  No erythema, no edema, no drainage, no flocculence.  Musculoskeletal:  Normal muscle strength 5/5 to all lower extremity muscle groups bilaterally. No pain crepitus or joint limitation noted with ROM b/l. No gross bony deformities bilaterally.  Neurological:  Protective sensation intact 5/5 intact bilaterally with 10g monofilament b/l. Vibratory sensation intact b/l. Proprioception intact bilaterally. Babinski reflex negative b/l.  Assessment and Plan:  1. Pain due to onychomycosis of toenail   2. Callus   3. Ingrown toenail without infection   4. Pain in both feet    -Examined patient. -Toenails 1-5 b/l were debrided in length and girth with sterile nail nippers and dremel without iatrogenic bleeding. Offending nail borders debrided and curretaged L hallux. Border cleansed with alcohol. Antibiotic ointment applied. No further treatment required by patient. -Callus(es) plantar aspect b/l heel pads and plantarlateral aspect of right foot pared utilizing sterile scalpel blade without complication or incident. Total number debrided =3. -Patient to report any pedal injuries to medical professional immediately. -Patient to continue soft, supportive shoe gear daily. -Patient/POA to call should there be question/concern in the interim.  Return in about 3 months (around 05/04/2020).  Marzetta Board, DPM

## 2020-05-04 ENCOUNTER — Other Ambulatory Visit: Payer: Self-pay

## 2020-05-04 ENCOUNTER — Ambulatory Visit: Payer: 59 | Admitting: Podiatry

## 2020-05-04 DIAGNOSIS — M79671 Pain in right foot: Secondary | ICD-10-CM

## 2020-05-04 DIAGNOSIS — M79676 Pain in unspecified toe(s): Secondary | ICD-10-CM

## 2020-05-04 DIAGNOSIS — M79672 Pain in left foot: Secondary | ICD-10-CM | POA: Diagnosis not present

## 2020-05-04 DIAGNOSIS — L84 Corns and callosities: Secondary | ICD-10-CM

## 2020-05-04 DIAGNOSIS — B351 Tinea unguium: Secondary | ICD-10-CM

## 2020-05-04 DIAGNOSIS — D573 Sickle-cell trait: Secondary | ICD-10-CM | POA: Diagnosis not present

## 2020-05-09 ENCOUNTER — Encounter: Payer: Self-pay | Admitting: Podiatry

## 2020-05-09 NOTE — Progress Notes (Signed)
Subjective:  Patient ID: Renee Durham, female    DOB: 1955-03-10,  MRN: [id number]  Renee Durham presents to clinic today for follow up with h/o sickle cell trait and callus(es) b/l heels and painful thick toenails that are difficult to trim. Painful toenails interfere with ambulation. Aggravating factors include wearing enclosed shoe gear. Pain is relieved with periodic professional debridement. Painful calluses are aggravated when weightbearing with and without shoegear. Pain is relieved with periodic professional debridement..   Current Outpatient Medications:  .  olmesartan (BENICAR) 40 MG tablet, Take 1 tablet by mouth daily., Disp: , Rfl:  .  acetaminophen (TYLENOL) 500 MG tablet, Take 500-1,000 mg by mouth every 6 (six) hours as needed for mild pain or moderate pain., Disp: , Rfl:  .  albuterol (PROVENTIL HFA;VENTOLIN HFA) 108 (90 Base) MCG/ACT inhaler, Inhale 2 puffs into the lungs every 6 (six) hours as needed for wheezing or shortness of breath. , Disp: , Rfl:  .  amLODipine (NORVASC) 10 MG tablet, Take 10 mg by mouth every morning. , Disp: , Rfl:  .  aspirin EC 81 MG tablet, Take 81 mg by mouth daily. , Disp: , Rfl:  .  baclofen (LIORESAL) 10 MG tablet, TAKE 1 TABLET BY MOUTH THREE TIMES A DAY AS NEEDED FOR MUSCLE SPASMS (Patient taking differently: Take 10 mg by mouth 3 (three) times daily as needed for muscle spasms.), Disp: 60 tablet, Rfl: 1 .  diclofenac sodium (VOLTAREN) 1 % GEL, Apply 4 g topically 4 (four) times daily as needed., Disp: 500 g, Rfl: 6 .  ferrous sulfate 325 (65 FE) MG tablet, Take by mouth., Disp: , Rfl:  .  furosemide (LASIX) 20 MG tablet, Take 20 mg by mouth daily. , Disp: , Rfl:  .  ketoconazole (NIZORAL) 2 % shampoo, Apply topically., Disp: , Rfl:  .  losartan (COZAAR) 100 MG tablet, Take 100 mg by mouth every morning. , Disp: , Rfl:  .  methocarbamol (ROBAXIN) 500 MG tablet, Take 500 mg by mouth daily as needed for muscle spasms. , Disp: , Rfl:  .   metoprolol (TOPROL-XL) 200 MG 24 hr tablet, Take 200 mg by mouth at bedtime. , Disp: , Rfl:  .  oxybutynin (DITROPAN-XL) 10 MG 24 hr tablet, Take 10 mg by mouth daily., Disp: , Rfl: 10 .  terbinafine (LAMISIL) 250 MG tablet, Take 1 tablet (250 mg total) by mouth daily., Disp: 90 tablet, Rfl: 0  No Known Allergies  Review of Systems: Negative except as noted in the HPI. Objective:   Constitutional Renee Durham is a pleasant 65 y.o. African American female, in NAD. AAO x 3.   Vascular Capillary refill time to digits immediate b/l. Palpable pedal pulses b/l LE. Pedal hair sparse. Lower extremity skin temperature gradient within normal limits. No cyanosis or clubbing noted.  Neurologic Normal speech. Oriented to person, place, and time. Protective sensation intact 5/5 intact bilaterally with 10g monofilament b/l. Vibratory sensation intact b/l.  Dermatologic Pedal skin with normal turgor, texture and tone bilaterally. No open wounds bilaterally. No interdigital macerations bilaterally. Toenails 1-5 b/l elongated, discolored, dystrophic, thickened, crumbly with subungual debris and tenderness to dorsal palpation. Hyperkeratotic lesion(s) plantar aspect b/l heel pads.  No erythema, no edema, no drainage, no fluctuance.  Orthopedic: Normal muscle strength 5/5 to all lower extremity muscle groups bilaterally. No pain crepitus or joint limitation noted with ROM b/l. No gross bony deformities bilaterally.   Radiographs: None Assessment:   1. Pain due to  onychomycosis of toenail   2. Callus   3. Pain in both feet   4. Sickle cell trait ([place])    Plan:  Patient was evaluated and treated and all questions answered.  Onychomycosis with pain -Nails palliatively debridement as below -Educated on self-care  Procedure: Nail Debridement Rationale: Pain Type of Debridement: manual, sharp debridement. Instrumentation: Nail nipper, rotary burr. Number of Nails: 10 -Examined patient. -No new  findings. No new orders. -Patient to continue soft, supportive shoe gear daily. -Toenails 1-5 b/l were debrided in length and girth with sterile nail nippers and dremel without iatrogenic bleeding.  -Callus(es) plantar aspect b/l heel pads pared utilizing sterile scalpel blade without complication or incident. Total number debrided =2. -Patient to report any pedal injuries to medical professional immediately. -Patient/POA to call should there be question/concern in the interim.  Return in about 3 months (around 08/04/2020).  Marzetta Board, DPM

## 2020-07-19 IMAGING — CT CT ANGIO CHEST
2 of 6 series · 18 of 36 positions shown · IV contrast (OMNIPAQUE 350)
Comparison: Same-day radiograph, radiograph 11/12/2018

CLINICAL DATA: PE suspected, high pretest probability

EXAM:
CT ANGIOGRAPHY CHEST WITH CONTRAST
TECHNIQUE: Multidetector CT imaging of the chest was performed using the
standard protocol during bolus administration of intravenous
contrast. Multiplanar CT image reconstructions and MIPs were
obtained to evaluate the vascular anatomy.
CONTRAST:  100mL OMNIPAQUE IOHEXOL 350 MG/ML SOLN

[Series 5: thins · axial · 0.71mm/px · z∈[-307,-90]mm · 17 of 243 slices shown]
[im 13/243  lung]
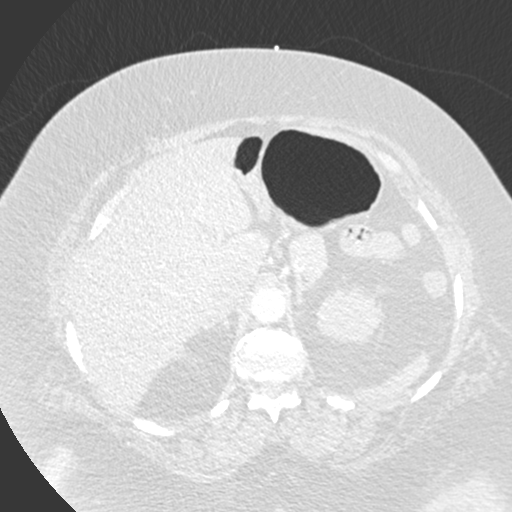
[im 25/243  mediastinal]
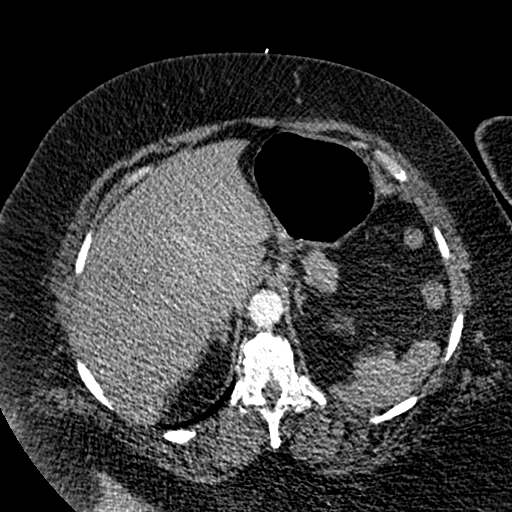
[im 37/243  lung]
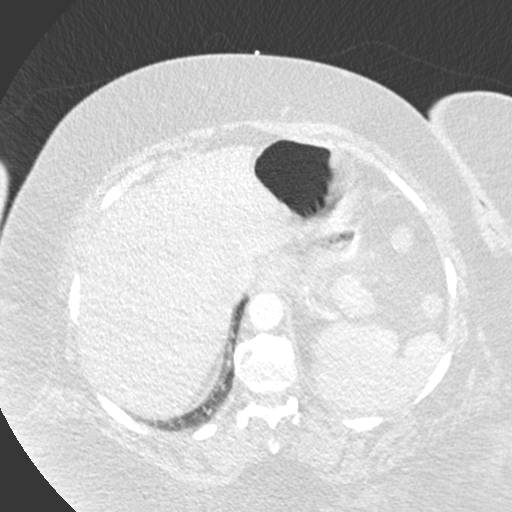
[im 49/243  mediastinal]
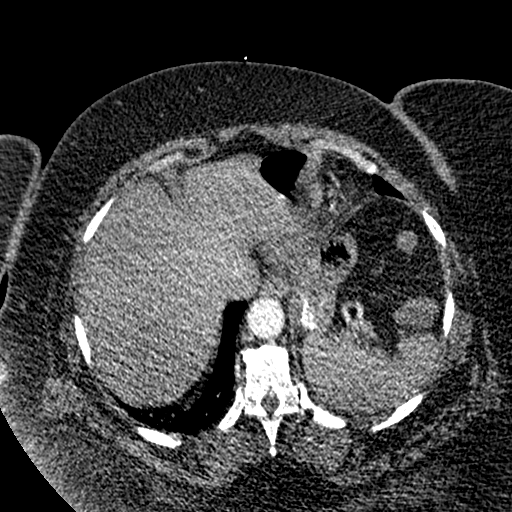
[im 73/243  lung]
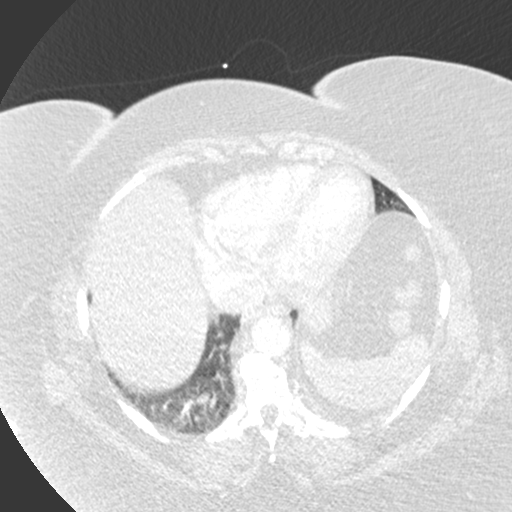
[im 85/243  mediastinal]
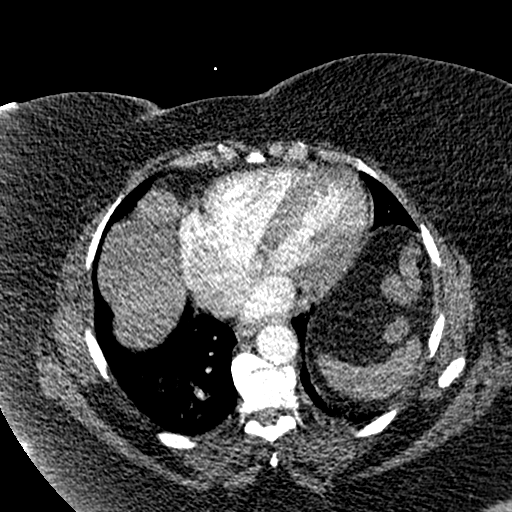
[im 97/243  lung]
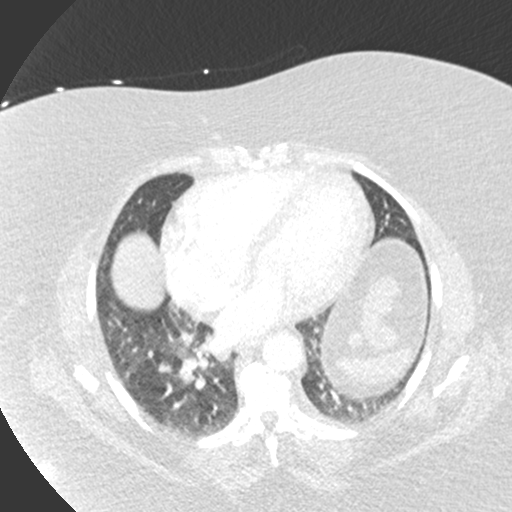
[im 109/243  mediastinal]
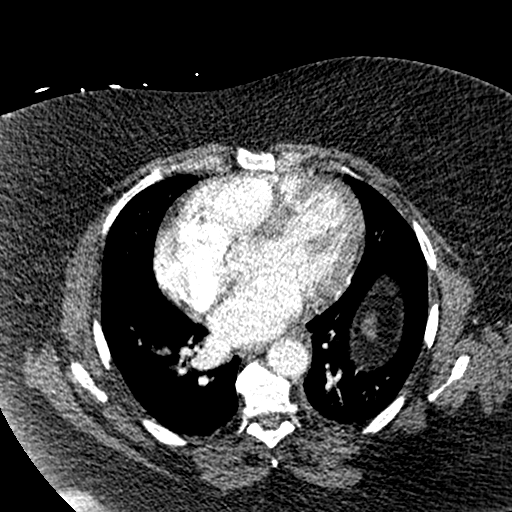
[im 122/243  lung]
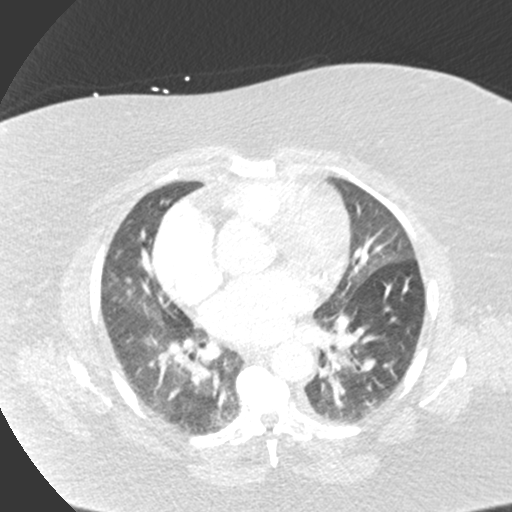
[im 134/243  mediastinal]
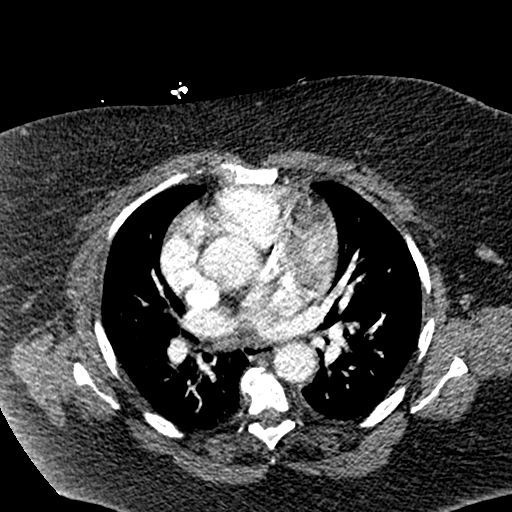
[im 146/243  lung]
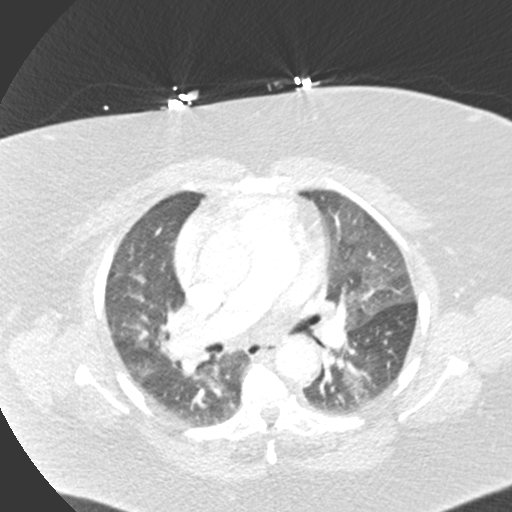
[im 158/243  mediastinal]
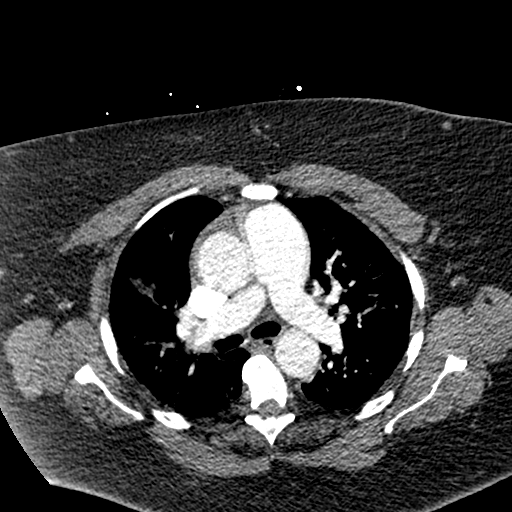
[im 170/243  lung]
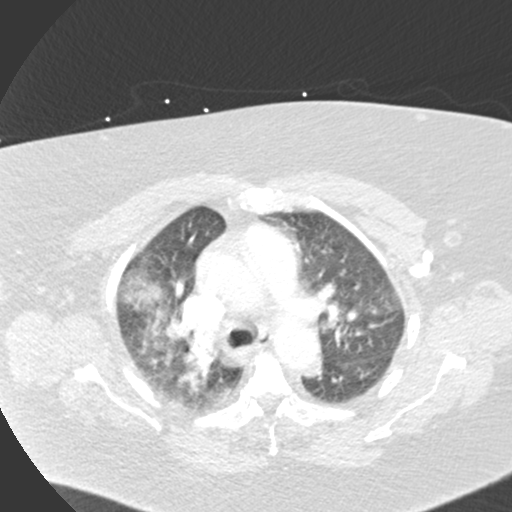
[im 194/243  mediastinal]
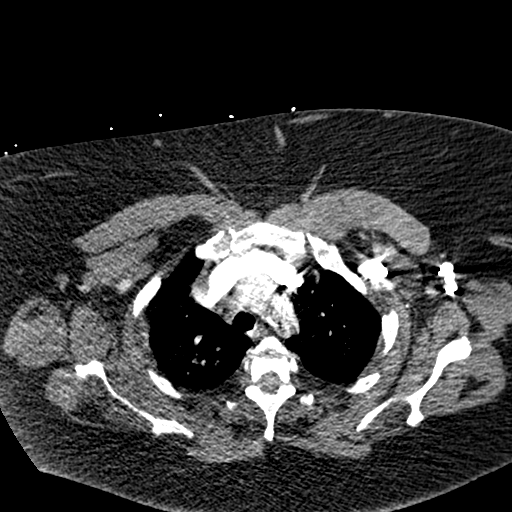
[im 206/243  lung]
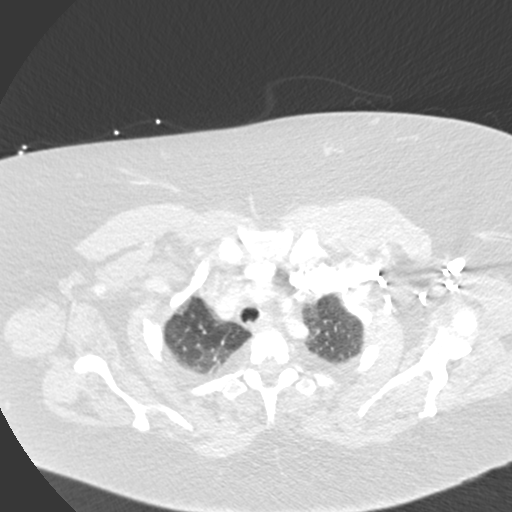
[im 218/243  mediastinal]
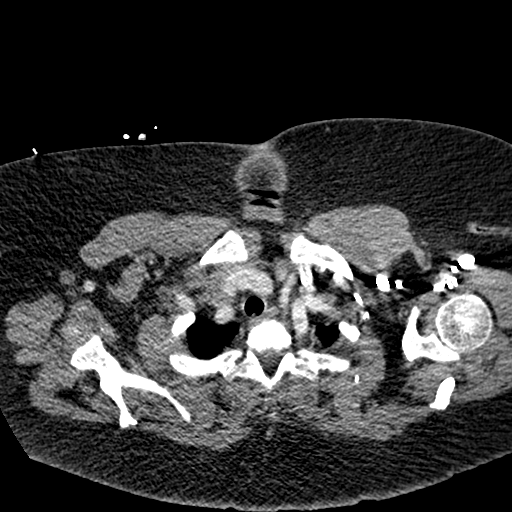
[im 230/243  lung]
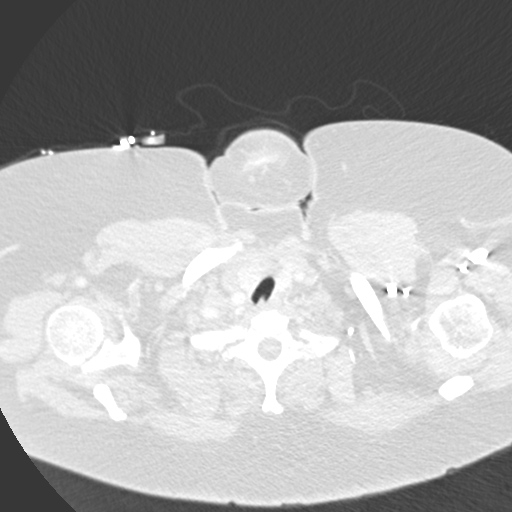

[Series 6: coronal mpr · coronal · 0.51mm/px · 1 of 140 slices shown]
[im 70/140  mediastinal]
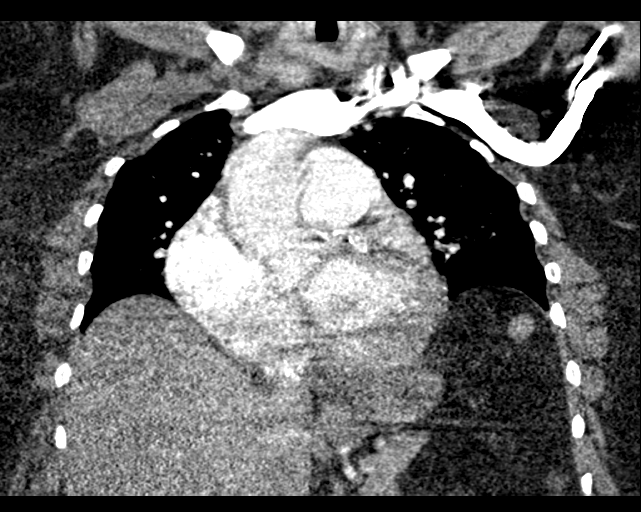

[18 of 36 positions shown; findings below may reference images not displayed]

FINDINGS: Cardiovascular: Slightly suboptimal contrast opacification of
pulmonary arteries limiting evaluation to the central and lobar
branches. No visible filling defects are evident. More distal
evaluation is limited by poor contrast bolus and respiratory motion.
The central pulmonary arteries are enlarged. No elevation of the
RV-LV ratio (0.8) cardiomegaly with biatrial enlargement is present.
Atherosclerotic calcification of the coronary arteries. Aorta is
normal caliber with few atheromatous calcifications. Three vessel
branching of the arch. Mild tortuosity of the right brachiocephalic
vessels.

Mediastinum/Nodes: No enlarged mediastinal or axillary lymph nodes.
Thyroid gland, trachea, and esophagus demonstrate no significant
findings.

Lungs/Pleura: Patchy areas of multifocal consolidation and
ground-glass opacity most pronounced in the right upper lobe, right
lower lobe and left upper lobe. Findings on a background of low
volumes and atelectasis with mosaic attenuation likely accentuated
by imaging during exhalation.

Upper Abdomen: No acute abnormalities present in the visualized
portions of the upper abdomen.

Musculoskeletal: Slight exaggeration of the thoracic kyphosis.
Multilevel degenerative changes are present in the imaged portions
of the spine. Multilevel flowing anterior osteophytosis compatible
features of diffuse idiopathic skeletal hyperostosis (DISH). No
acute osseous abnormality or suspicious osseous lesion. No
suspicious chest wall lesions.

Review of the MIP images confirms the above findings.
IMPRESSION: 1. No central or lobar pulmonary embolism is seen. More distal
evaluation is limited by suboptimal contrast bolus and respiratory
motion.
2. Patchy areas of multifocal consolidation and ground-glass opacity
most pronounced in the right upper lobe, right lower lobe and left
upper lobe, concerning for multifocal pneumonia including atypical
viral etiologies (such as [id number]).
3. Cardiomegaly with biatrial enlargement. Coronary atherosclerosis.
4. Aortic Atherosclerosis ([id number].U).
5. Features of diffuse idiopathic skeletal hyperostosis.

## 2020-08-18 ENCOUNTER — Ambulatory Visit: Payer: 59 | Admitting: Podiatry

## 2020-09-01 ENCOUNTER — Ambulatory Visit: Payer: 59 | Admitting: Podiatry

## 2020-09-01 ENCOUNTER — Other Ambulatory Visit: Payer: Self-pay

## 2020-09-01 ENCOUNTER — Encounter: Payer: Self-pay | Admitting: Podiatry

## 2020-09-01 DIAGNOSIS — M79671 Pain in right foot: Secondary | ICD-10-CM | POA: Diagnosis not present

## 2020-09-01 DIAGNOSIS — M79676 Pain in unspecified toe(s): Secondary | ICD-10-CM | POA: Diagnosis not present

## 2020-09-01 DIAGNOSIS — M79672 Pain in left foot: Secondary | ICD-10-CM | POA: Diagnosis not present

## 2020-09-01 DIAGNOSIS — L84 Corns and callosities: Secondary | ICD-10-CM | POA: Diagnosis not present

## 2020-09-01 DIAGNOSIS — D573 Sickle-cell trait: Secondary | ICD-10-CM | POA: Diagnosis not present

## 2020-09-01 DIAGNOSIS — B351 Tinea unguium: Secondary | ICD-10-CM

## 2020-09-04 NOTE — Progress Notes (Signed)
  Subjective:  Patient ID: Renee Durham, female    DOB: 1955/02/28,  MRN: [id number]  Renee Durham presents to clinic today with h/o sickle cell trait. She is seen for painful heel callus(es) bilaterally and painful thick toenails that are difficult to trim. Painful toenails interfere with ambulation. Aggravating factors include wearing enclosed shoe gear. Pain is relieved with periodic professional debridement. Painful calluses are aggravated when weightbearing with and without shoegear. Pain is relieved with periodic professional debridement.  Renee Durham states she is having to pack and move from her home she has been renting for the past twelve years.  PCP is Bartholome Bill, MD , and last visit was 04/08/2020.  No Known Allergies  Review of Systems: Negative except as noted in the HPI. Objective:   Constitutional Renee Durham is a pleasant 65 y.o. African American female, morbidly obese in NAD. AAO x 3.   Vascular Capillary refill time to digits immediate b/l. Palpable pedal pulses b/l LE. Pedal hair sparse. Lower extremity skin temperature gradient within normal limits. No pain with calf compression b/l. Lymphedema present b/l lower extremities. No cyanosis or clubbing noted.  Neurologic Normal speech. Oriented to person, place, and time. Protective sensation intact 5/5 intact bilaterally with 10g monofilament b/l. Vibratory sensation intact b/l.  Dermatologic No open wounds b/l lower extremities and plantar aspect b/l heel pads. No interdigital macerations b/l lower extremities. Toenails 1-5 b/l elongated, discolored, dystrophic, thickened, crumbly with subungual debris and tenderness to dorsal palpation. Hyperkeratotic lesion(s) b/l lower extremities.  No erythema, no edema, no drainage, no fluctuance. Skin b/l lower extremities noted to be thickened and brawny consistent with lymphedema.  Orthopedic: Normal muscle strength 5/5 to all lower extremity muscle groups  bilaterally. No pain crepitus or joint limitation noted with ROM b/l. No gross bony deformities bilaterally.   Radiographs: None Assessment:   1. Pain due to onychomycosis of toenail   2. Callus   3. Pain in both feet   4. Sickle cell trait ([place])    Plan:  -No new findings. No new orders. -Patient to continue soft, supportive shoe gear daily. -Toenails 1-5 b/l were debrided in length and girth with sterile nail nippers and dremel without iatrogenic bleeding.  -Callus(es) plantar aspect of heel b/l feet pared utilizing sterile scalpel blade without complication or incident. Total number debrided =2. -Patient to report any pedal injuries to medical professional immediately. -Patient/POA to call should there be question/concern in the interim.  Return in about 3 months (around 12/02/2020).  Marzetta Board, DPM

## 2020-09-18 DIAGNOSIS — R7303 Prediabetes: Secondary | ICD-10-CM | POA: Insufficient documentation

## 2020-09-18 DIAGNOSIS — N3281 Overactive bladder: Secondary | ICD-10-CM | POA: Insufficient documentation

## 2020-09-24 ENCOUNTER — Telehealth: Payer: Self-pay | Admitting: Podiatry

## 2020-09-24 NOTE — Telephone Encounter (Signed)
Patient called the office stating she has been having left foot pain especially around her heel and would like your advice on what to do next.

## 2020-09-28 ENCOUNTER — Other Ambulatory Visit: Payer: Self-pay

## 2020-09-28 ENCOUNTER — Ambulatory Visit: Payer: 59 | Admitting: Podiatry

## 2020-09-28 DIAGNOSIS — M722 Plantar fascial fibromatosis: Secondary | ICD-10-CM

## 2020-09-28 DIAGNOSIS — I890 Lymphedema, not elsewhere classified: Secondary | ICD-10-CM

## 2020-09-28 MED ORDER — BETAMETHASONE SOD PHOS & ACET 6 (3-3) MG/ML IJ SUSP: 3.0000 mg | Freq: Once | INTRAMUSCULAR | Status: AC

## 2020-09-28 NOTE — Progress Notes (Signed)
   Subjective: 65 y.o. female established to the practice presenting today for evaluation of left heel pain has been going on for several weeks now.  Patient states that about 2 weeks ago she moved into her daughter's home with hardwood floors.  Her other home had carpet.  She says that she began to notice increased pain in her left heel when walking around with socks around her daughter's home on the hardwood floors.  She denies a history of injury.  She presents for further treatment and evaluation   Past Medical History:  Diagnosis Date   Anemia    Arthritis    knees/back   Chronic kidney disease    H/O sickle cell trait    Hypertension    Obesities, morbid ([place])      Objective: Physical Exam General: The patient is alert and oriented x3 in no acute distress.  Dermatology: Skin is warm, dry and supple bilateral lower extremities. Negative for open lesions or macerations bilateral.   Vascular: Chronic bilateral lower extremity lymphedema noted Neurological: Epicritic and protective threshold intact bilateral.   Musculoskeletal: Tenderness to palpation to the plantar aspect of the left heel along the plantar fascia. All other joints range of motion within normal limits bilateral. Strength 5/5 in all groups bilateral.   Assessment: 1. Plantar fasciitis left foot 2.  Morbid obesity with chronic lymphedema bilateral lower extremities  Plan of Care:  1. Patient evaluated. Xrays reviewed.   2. Injection of 0.5cc Celestone soluspan injected into the left plantar fascia.  3.  Patient states that she began to experience pain when she moved into her daughter's house with hardwood floors.  She only wear socks around the house.  Recommend that the patient wear socks and good supportive tennis shoes at the home.  Advised against going barefoot 4.  Return to clinic as needed   Edrick Kins, DPM [facility]  Dr. Edrick Kins, DPM    [address], [place] [address]                Office [phone]  Fax [phone]

## 2020-12-02 ENCOUNTER — Encounter: Payer: Self-pay | Admitting: Podiatry

## 2020-12-02 ENCOUNTER — Ambulatory Visit: Payer: 59 | Admitting: Podiatry

## 2020-12-02 ENCOUNTER — Other Ambulatory Visit: Payer: Self-pay

## 2020-12-02 DIAGNOSIS — B351 Tinea unguium: Secondary | ICD-10-CM

## 2020-12-02 DIAGNOSIS — M79671 Pain in right foot: Secondary | ICD-10-CM

## 2020-12-02 DIAGNOSIS — M79676 Pain in unspecified toe(s): Secondary | ICD-10-CM

## 2020-12-02 DIAGNOSIS — L84 Corns and callosities: Secondary | ICD-10-CM

## 2020-12-02 DIAGNOSIS — M79672 Pain in left foot: Secondary | ICD-10-CM

## 2020-12-08 NOTE — Progress Notes (Signed)
Subjective: Renee Durham is a 65 y.o. female patient seen today for follow up of  painful thick toenails and plantar calluses b/l. Toenails  are difficult to trim. Pain interferes with ambulation. Aggravating factors include wearing enclosed shoe gear. Pain is relieved with periodic professional debridement.  She did receive steroid injection for left heel pain by Dr. Amalia Hailey. She also states she purchased rubber heel pads which have helped with her heel pain.  New problems reported today: None.  PCP is Bartholome Bill, MD. Last visit was: 09/18/2020.  No Known Allergies  Objective: Physical Exam  General: Patient is a pleasant 65 y.o. African American female morbidly obese in NAD. AAO x 3.   Neurovascular Examination: Capillary refill time to digits immediate b/l. Palpable pedal pulses b/l LE. Pedal hair absent. Lower extremity skin temperature gradient within normal limits. Lymphedema present BLE.  Protective sensation intact 5/5 intact bilaterally with 10g monofilament b/l. Vibratory sensation intact b/l.  Dermatological:  Skin warm and supple b/l lower extremities. No open wounds b/l LE. No interdigital macerations noted b/l LE. Toenails 1-5 b/l elongated, discolored, dystrophic, thickened, crumbly with subungual debris and tenderness to dorsal palpation. Hyperkeratotic lesion(s) plantar aspect b/l heel pads.  No erythema, no edema, no drainage, no fluctuance.  Musculoskeletal:  Normal muscle strength 5/5 to all lower extremity muscle groups bilaterally. No pain crepitus or joint limitation noted with ROM b/l lower extremities. No gross bony deformities b/l feet.  Assessment: 1. Pain due to onychomycosis of toenail   2. Callus   3. Pain in both feet    Plan: Patient was evaluated and treated and all questions answered. Consent given for treatment as described below: -No new findings. No new orders. -Patient to continue soft, supportive shoe gear daily. -Mycotic  toenails were debrided in length and girth with sterile nail nippers and dremel without iatrogenic bleeding. -Callus(es) plantar aspect b/l heel pads pared utilizing sterile scalpel blade without complication or incident. Total number debrided =2. -Patient to report any pedal injuries to medical professional immediately. -Patient/POA to call should there be question/concern in the interim.  Return in about 3 months (around 03/04/2021).  Marzetta Board, DPM

## 2021-03-16 ENCOUNTER — Ambulatory Visit: Payer: 59 | Admitting: Podiatry

## 2021-03-16 ENCOUNTER — Encounter: Payer: Self-pay | Admitting: Podiatry

## 2021-03-16 ENCOUNTER — Other Ambulatory Visit: Payer: Self-pay

## 2021-03-16 DIAGNOSIS — M79671 Pain in right foot: Secondary | ICD-10-CM | POA: Diagnosis not present

## 2021-03-16 DIAGNOSIS — L84 Corns and callosities: Secondary | ICD-10-CM

## 2021-03-16 DIAGNOSIS — M79672 Pain in left foot: Secondary | ICD-10-CM | POA: Diagnosis not present

## 2021-03-16 DIAGNOSIS — B351 Tinea unguium: Secondary | ICD-10-CM | POA: Diagnosis not present

## 2021-03-16 DIAGNOSIS — M79676 Pain in unspecified toe(s): Secondary | ICD-10-CM | POA: Diagnosis not present

## 2021-03-21 NOTE — Progress Notes (Signed)
°  Subjective:  Patient ID: Renee Durham, female    DOB: 1955/11/13,  MRN: [id number]  Renee Durham presents to clinic today for callus(es) bilateral heels and painful thick toenails that are difficult to trim. Painful toenails interfere with ambulation. Aggravating factors include wearing enclosed shoe gear. Pain is relieved with periodic professional debridement. Painful calluses are aggravated when weightbearing with and without shoegear. Pain is relieved with periodic professional debridement.  New problem(s): None.   PCP is Bartholome Bill, MD , and last visit was 09/18/2020.  No Known Allergies  Review of Systems: Negative except as noted in the HPI. Objective:   Constitutional Renee Durham is a pleasant 66 y.o. African American female, morbidly obese in NAD. AAO x 3.   Vascular Capillary refill time to digits immediate b/l. Palpable DP pulse(s) b/l LE. Palpable PT pulse(s) b/l LE. Pedal hair absent. No pain with calf compression b/l. Lower extremity skin temperature gradient within normal limits. Lymphedema present BLE.  Neurologic Normal speech. Oriented to person, place, and time. Protective sensation intact 5/5 intact bilaterally with 10g monofilament b/l. Vibratory sensation intact b/l.  Dermatologic Pedal integument with normal turgor, texture and tone b/l LE. No open wounds b/l. No interdigital macerations b/l. Toenails 1-5 b/l elongated, thickened, discolored with subungual debris. +Tenderness with dorsal palpation of nailplates. Hyperkeratotic lesion(s) noted bilateral heels.  Orthopedic: Muscle strength 5/5 to all lower extremity muscle groups bilaterally. Pes planus deformity noted bilateral LE. Utilizes rollator for ambulation assistance.   Radiographs: None  Last A1c: No flowsheet data found.   Assessment:   1. Pain due to onychomycosis of toenail   2. Callus   3. Pain in both feet    Plan:  Patient was evaluated and treated and all questions  answered. Consent given for treatment as described below: -Toenails 1-5 b/l were debrided in length and girth with sterile nail nippers and dremel without iatrogenic bleeding.  -Callus(es) plantar heel pad of both feet pared utilizing mandrel sander without complication or incident. Total number debrided =2. -Patient/POA to call should there be question/concern in the interim.  Return in about 3 months (around 06/13/2021).  Marzetta Board, DPM

## 2021-06-14 ENCOUNTER — Ambulatory Visit: Payer: 59 | Admitting: Podiatry

## 2021-06-14 ENCOUNTER — Encounter: Payer: Self-pay | Admitting: Podiatry

## 2021-06-14 DIAGNOSIS — B351 Tinea unguium: Secondary | ICD-10-CM | POA: Diagnosis not present

## 2021-06-14 DIAGNOSIS — M79674 Pain in right toe(s): Secondary | ICD-10-CM

## 2021-06-14 DIAGNOSIS — L84 Corns and callosities: Secondary | ICD-10-CM

## 2021-06-14 DIAGNOSIS — M79671 Pain in right foot: Secondary | ICD-10-CM | POA: Diagnosis not present

## 2021-06-14 DIAGNOSIS — M79672 Pain in left foot: Secondary | ICD-10-CM

## 2021-06-14 DIAGNOSIS — M79675 Pain in left toe(s): Secondary | ICD-10-CM

## 2021-06-22 NOTE — Progress Notes (Signed)
?  Subjective:  ?Patient ID: Renee Durham, female    DOB: [birth year],  MRN: [id number] ? ?Renee Durham presents to clinic today for callus(es) b/l lower extremities and painful thick toenails that are difficult to trim. Painful toenails interfere with ambulation. Aggravating factors include wearing enclosed shoe gear. Pain is relieved with periodic professional debridement. Painful calluses are aggravated when weightbearing with and without shoegear. Pain is relieved with periodic professional debridement. ? ?New problem(s): None.  ? ?She has brought in a pair of gel heel pads and would like me to apply them for her after today's treatment. ? ?PCP is Bartholome Bill, MD , and last visit was May 31, 2021. ? ?No Known Allergies ? ?Review of Systems: Negative except as noted in the HPI. ? ?Objective: No changes noted in today's physical examination. ?Constitutional Renee Durham is a pleasant 66 y.o. African American female, morbidly obese in NAD. AAO x 3.   ?Vascular Capillary refill time to digits immediate b/l. Palpable DP pulse(s) b/l LE. Palpable PT pulse(s) b/l LE. Pedal hair absent. No pain with calf compression b/l. Lower extremity skin temperature gradient within normal limits. Lymphedema present BLE.  ?Neurologic Normal speech. Oriented to person, place, and time. Protective sensation intact 5/5 intact bilaterally with 10g monofilament b/l. Vibratory sensation intact b/l.  ?Dermatologic Pedal integument with normal turgor, texture and tone b/l LE. No open wounds b/l. No interdigital macerations b/l. Toenails 1-5 b/l elongated, thickened, discolored with subungual debris. +Tenderness with dorsal palpation of nailplates. Hyperkeratotic lesion(s) bilateral heels and sub 5th met base b/l lower extremities.  No erythema, no edema, no drainage, no fluctuance.   ?Orthopedic: Muscle strength 5/5 to all lower extremity muscle groups bilaterally. Pes planus deformity noted bilateral LE. Utilizes  rollator for ambulation assistance. The gel heel pads she brought in today are not adequate due the amount of ankle edema and the velcro straps are not able to be secured.  ? ?Radiographs: None ? ?Last A1c: No flowsheet data found. ? ?Assessment/Plan: ?1. Pain due to onychomycosis of toenail   ?2. Callus   ?3. Pain in both feet   ?  ?-Examined patient. ?-Patient to continue soft, supportive shoe gear daily. ?-Mycotic toenails 1-5 bilaterally were debrided in length and girth with sterile nail nippers and dremel without incident. ?-Callus(es) bilateral heels and sub 5th met base b/l lower extremities pared utilizing mandrel sander without complication or incident. Total number debrided =4. ?-Patient/POA to call should there be question/concern in the interim.  ? ?Return in about 3 months (around 09/14/2021). ? ?Marzetta Board, DPM  ?

## 2021-09-20 ENCOUNTER — Ambulatory Visit: Payer: 59 | Admitting: Podiatry

## 2021-09-20 ENCOUNTER — Encounter: Payer: Self-pay | Admitting: Podiatry

## 2021-09-20 DIAGNOSIS — M79674 Pain in right toe(s): Secondary | ICD-10-CM | POA: Diagnosis not present

## 2021-09-20 DIAGNOSIS — L84 Corns and callosities: Secondary | ICD-10-CM | POA: Diagnosis not present

## 2021-09-20 DIAGNOSIS — M79671 Pain in right foot: Secondary | ICD-10-CM | POA: Diagnosis not present

## 2021-09-20 DIAGNOSIS — M79675 Pain in left toe(s): Secondary | ICD-10-CM

## 2021-09-20 DIAGNOSIS — R7303 Prediabetes: Secondary | ICD-10-CM

## 2021-09-20 DIAGNOSIS — B351 Tinea unguium: Secondary | ICD-10-CM

## 2021-09-20 DIAGNOSIS — M79672 Pain in left foot: Secondary | ICD-10-CM | POA: Diagnosis not present

## 2021-09-27 NOTE — Progress Notes (Signed)
  Subjective:  Patient ID: Renee Durham, female    DOB: Aug 05, 1955,  MRN: [id number]  Renee Durham presents to clinic today for at risk foot care with history of peripheral neuropathy and callus(es) b/l lower extremities and painful thick toenails that are difficult to trim. Painful toenails interfere with ambulation. Aggravating factors include wearing enclosed shoe gear. Pain is relieved with periodic professional debridement. Painful calluses are aggravated when weightbearing with and without shoegear. Pain is relieved with periodic professional debridement.  Last A1c was 8.3%. Patient did not check blood glucose today.  New problem(s): None.   PCP is Bartholome Bill, MD , and last visit was  May 31, 2021  No Known Allergies  Review of Systems: Negative except as noted in the HPI.  Objective: No changes noted in today's physical examination. Constitutional Renee Durham is a pleasant 66 y.o. African American female, morbidly obese in NAD. AAO x 3.   Vascular Capillary refill time to digits immediate b/l. Palpable DP pulse(s) b/l LE. Palpable PT pulse(s) b/l LE. Pedal hair absent. No pain with calf compression b/l. Lower extremity skin temperature gradient within normal limits. Lymphedema present BLE.  Neurologic Normal speech. Oriented to person, place, and time. Protective sensation intact 5/5 intact bilaterally with 10g monofilament b/l. Vibratory sensation intact b/l.  Dermatologic Pedal integument with normal turgor, texture and tone b/l LE. No open wounds b/l. No interdigital macerations b/l. Toenails 1-5 b/l elongated, thickened, discolored with subungual debris. +Tenderness with dorsal palpation of nailplates. Hyperkeratotic lesion(s) bilateral heels, submet head 3 right foot.  No erythema, no edema, no drainage, no fluctuance.   Orthopedic: Muscle strength 5/5 to all lower extremity muscle groups bilaterally. Pes planus deformity noted bilateral LE. Utilizes  rollator for ambulation assistance.    Radiographs: None Assessment/Plan: 1. Pain due to onychomycosis of toenail   2. Callus   3. Pain in both feet   4. Prediabetes     -Examined patient. -Toenails 1-5 b/l were debrided in length and girth with sterile nail nippers and dremel without iatrogenic bleeding.  -Callus(es) bilateral heels and submet head 3 right foot pared utilizing mandrel sander without complication or incident. Total number debrided =3. -Patient/POA to call should there be question/concern in the interim.   Return in about 3 months (around 12/21/2021).  Marzetta Board, DPM

## 2021-12-29 ENCOUNTER — Ambulatory Visit: Payer: 59 | Admitting: Podiatry

## 2021-12-29 ENCOUNTER — Encounter: Payer: Self-pay | Admitting: Podiatry

## 2021-12-29 DIAGNOSIS — B351 Tinea unguium: Secondary | ICD-10-CM

## 2021-12-29 DIAGNOSIS — L84 Corns and callosities: Secondary | ICD-10-CM

## 2021-12-29 DIAGNOSIS — M79672 Pain in left foot: Secondary | ICD-10-CM | POA: Diagnosis not present

## 2021-12-29 DIAGNOSIS — M79671 Pain in right foot: Secondary | ICD-10-CM

## 2021-12-29 DIAGNOSIS — M79676 Pain in unspecified toe(s): Secondary | ICD-10-CM

## 2021-12-29 DIAGNOSIS — R7303 Prediabetes: Secondary | ICD-10-CM | POA: Diagnosis not present

## 2022-01-04 NOTE — Progress Notes (Unsigned)
  Subjective:  Patient ID: Renee Durham, female    DOB: 1955-11-30,  MRN: [id number]  Renee Durham presents to clinic today for callus(es) b/l lower extremities and painful thick toenails that are difficult to trim. Painful toenails interfere with ambulation. Aggravating factors include wearing enclosed shoe gear. Pain is relieved with periodic professional debridement. Painful calluses are aggravated when weightbearing with and without shoegear. Pain is relieved with periodic professional debridement.  Chief Complaint  Patient presents with   Nail Problem    Routine foot care PCP-Tammy Boyd PCP VST- Less than 2 month ago   New problem(s): None.   PCP is Bartholome Bill, MD.  No Known Allergies  Review of Systems: Negative except as noted in the HPI.  Objective: No changes noted in today's physical examination.  Renee Durham is a pleasant 66 y.o. female morbidly obese in NAD. AAO x 3.  Vascular Capillary refill time to digits immediate b/l. Palpable DP pulse(s) b/l LE. Palpable PT pulse(s) b/l LE. Pedal hair absent. No pain with calf compression b/l. Lower extremity skin temperature gradient within normal limits. Lymphedema present BLE.  Neurologic Normal speech. Oriented to person, place, and time. Protective sensation intact 5/5 intact bilaterally with 10g monofilament b/l. Vibratory sensation intact b/l.  Dermatologic Pedal integument with normal turgor, texture and tone b/l LE. No open wounds b/l. No interdigital macerations b/l.   Toenails 1-5 b/l elongated, thickened, discolored with subungual debris. +Tenderness with dorsal palpation of nailplates.   Hyperkeratotic lesion(s) bilateral heels, submet head 3 right foot.  No erythema, no edema, no drainage, no fluctuance.   Orthopedic: Muscle strength 5/5 to all lower extremity muscle groups bilaterally. Pes planus deformity noted bilateral LE. Utilizes rollator for ambulation assistance.    Radiographs:  None  Assessment/Plan: 1. Pain due to onychomycosis of toenail   2. Callus   3. Pain in both feet   4. Prediabetes     No orders of the defined types were placed in this encounter.   -Consent given for treatment as described below: -Continue daily moisturizer to feet. -Mycotic toenails 1-5 bilaterally were debrided in length and girth with sterile nail nippers and dremel without incident. -Callus(es) bilateral heels and submet head 3 right foot pared utilizing rotary bur without complication or incident. Total number pared =3. -Patient/POA to call should there be question/concern in the interim.   Return in about 3 months (around 03/31/2022).  Marzetta Board, DPM

## 2022-04-20 ENCOUNTER — Ambulatory Visit: Payer: 59 | Admitting: Podiatry

## 2022-08-16 ENCOUNTER — Ambulatory Visit: Payer: 59 | Admitting: Podiatry

## 2022-08-16 DIAGNOSIS — R7303 Prediabetes: Secondary | ICD-10-CM

## 2022-08-16 DIAGNOSIS — L84 Corns and callosities: Secondary | ICD-10-CM

## 2022-08-16 DIAGNOSIS — B351 Tinea unguium: Secondary | ICD-10-CM

## 2022-08-16 DIAGNOSIS — M79676 Pain in unspecified toe(s): Secondary | ICD-10-CM

## 2022-08-16 NOTE — Progress Notes (Signed)
  Subjective:  Patient ID: Renee Durham, female    DOB: 10-Aug-1955,  MRN: [id number]  Renee Durham presents to clinic today for: callus(es) both feet and painful thick toenails that are difficult to trim. Painful toenails interfere with ambulation. Aggravating factors include wearing enclosed shoe gear. Pain is relieved with periodic professional debridement. Painful calluses are aggravated when weightbearing with and without shoegear. Pain is relieved with periodic professional debridement.  Chief Complaint  Patient presents with   NAIL CARE    RFC   PCP is Verlon Au, MD.  No Known Allergies  Review of Systems: Negative except as noted in the HPI.  Objective: No changes noted in today's physical examination. There were no vitals filed for this visit.  Renee Durham is a pleasant 67 y.o. female in NAD. AAO x 3.  Vascular Examination: Capillary refill time <3 seconds b/l LE. Palpable pedal pulses b/l LE. Digital hair absent b/l. . Skin temperature gradient WNL b/l.Marland Kitchen Lymphedema present BLE.Marland Kitchen  Dermatological Examination: Pedal skin with normal turgor, texture and tone b/l. No open wounds. No interdigital macerations b/l. Toenails 1-5 b/l thickened, discolored, dystrophic with subungual debris. There is pain on palpation to dorsal aspect of nailplates. Hyperkeratotic lesion(s) left heel and right heel.  No erythema, no edema, no drainage, no fluctuance..  Neurological Examination: Protective sensation intact with 10 gram monofilament b/l LE. Vibratory sensation intact b/l LE.   Musculoskeletal Examination: Muscle strength 5/5 to all lower extremity muscle groups bilaterally. Pes planus deformity noted bilateral LE.  Assessment/Plan: 1. Pain due to onychomycosis of toenail   2. Callus   3. Prediabetes     -Patient was evaluated and treated. All patient's and/or POA's questions/concerns answered on today's visit. -Patient to continue soft, supportive shoe gear  daily. -Toenails 1-5 b/l were debrided in length and girth with sterile nail nippers and dremel without iatrogenic bleeding.  -Callus(es) left heel and right heel pared utilizing rotary bur without complication or incident. Total number pared =2. -Patient/POA to call should there be question/concern in the interim.   Return in about 3 months (around 11/16/2022).  Freddie Breech, DPM

## 2022-08-22 ENCOUNTER — Encounter: Payer: Self-pay | Admitting: Podiatry

## 2022-11-23 ENCOUNTER — Ambulatory Visit: Payer: 59 | Admitting: Podiatry

## 2022-11-29 ENCOUNTER — Ambulatory Visit: Payer: 59 | Admitting: Podiatry

## 2023-01-18 ENCOUNTER — Encounter (HOSPITAL_COMMUNITY): Payer: Self-pay

## 2023-01-18 ENCOUNTER — Other Ambulatory Visit: Payer: Self-pay

## 2023-01-18 ENCOUNTER — Observation Stay (HOSPITAL_COMMUNITY)
Admission: EM | Admit: 2023-01-18 | Discharge: 2023-01-19 | Disposition: A | Discharge status: Home / Self Care | Payer: 59 | Attending: Family Medicine | Admitting: Family Medicine

## 2023-01-18 ENCOUNTER — Emergency Department (HOSPITAL_COMMUNITY): Payer: 59

## 2023-01-18 DIAGNOSIS — Z7982 Long term (current) use of aspirin: Secondary | ICD-10-CM | POA: Insufficient documentation

## 2023-01-18 DIAGNOSIS — I129 Hypertensive chronic kidney disease with stage 1 through stage 4 chronic kidney disease, or unspecified chronic kidney disease: Secondary | ICD-10-CM | POA: Diagnosis not present

## 2023-01-18 DIAGNOSIS — J40 Bronchitis, not specified as acute or chronic: Secondary | ICD-10-CM | POA: Diagnosis not present

## 2023-01-18 DIAGNOSIS — N189 Chronic kidney disease, unspecified: Secondary | ICD-10-CM | POA: Insufficient documentation

## 2023-01-18 DIAGNOSIS — R0602 Shortness of breath: Secondary | ICD-10-CM | POA: Diagnosis present

## 2023-01-18 DIAGNOSIS — Z79899 Other long term (current) drug therapy: Secondary | ICD-10-CM | POA: Diagnosis not present

## 2023-01-18 DIAGNOSIS — Z6841 Body Mass Index (BMI) 40.0 and over, adult: Secondary | ICD-10-CM | POA: Insufficient documentation

## 2023-01-18 DIAGNOSIS — J9601 Acute respiratory failure with hypoxia: Secondary | ICD-10-CM | POA: Diagnosis not present

## 2023-01-18 DIAGNOSIS — J449 Chronic obstructive pulmonary disease, unspecified: Secondary | ICD-10-CM | POA: Diagnosis not present

## 2023-01-18 DIAGNOSIS — Z1152 Encounter for screening for COVID-19: Secondary | ICD-10-CM | POA: Insufficient documentation

## 2023-01-18 DIAGNOSIS — E662 Morbid (severe) obesity with alveolar hypoventilation: Secondary | ICD-10-CM | POA: Insufficient documentation

## 2023-01-18 DIAGNOSIS — J9611 Chronic respiratory failure with hypoxia: Secondary | ICD-10-CM | POA: Diagnosis present

## 2023-01-18 DIAGNOSIS — J9621 Acute and chronic respiratory failure with hypoxia: Principal | ICD-10-CM | POA: Insufficient documentation

## 2023-01-18 DIAGNOSIS — I10 Essential (primary) hypertension: Secondary | ICD-10-CM | POA: Diagnosis present

## 2023-01-18 LAB — CBC WITH DIFFERENTIAL/PLATELET
Abs Immature Granulocytes: 0.02 10*3/uL (ref 0.00–0.07)
Basophils Absolute: 0 10*3/uL (ref 0.0–0.1)
Basophils Relative: 1 %
Eosinophils Absolute: 0.2 10*3/uL (ref 0.0–0.5)
Eosinophils Relative: 3 %
HCT: 35.6 % — ABNORMAL LOW (ref 36.0–46.0)
Hemoglobin: 10.7 g/dL — ABNORMAL LOW (ref 12.0–15.0)
Immature Granulocytes: 0 %
Lymphocytes Relative: 7 %
Lymphs Abs: 0.6 10*3/uL — ABNORMAL LOW (ref 0.7–4.0)
MCH: 24.5 pg — ABNORMAL LOW (ref 26.0–34.0)
MCHC: 30.1 g/dL (ref 30.0–36.0)
MCV: 81.7 fL (ref 80.0–100.0)
Monocytes Absolute: 0.7 10*3/uL (ref 0.1–1.0)
Monocytes Relative: 9 %
Neutro Abs: 6.3 10*3/uL (ref 1.7–7.7)
Neutrophils Relative %: 80 %
Platelets: 140 10*3/uL — ABNORMAL LOW (ref 150–400)
RBC: 4.36 MIL/uL (ref 3.87–5.11)
RDW: 17.7 % — ABNORMAL HIGH (ref 11.5–15.5)
WBC: 7.9 10*3/uL (ref 4.0–10.5)
nRBC: 0 % (ref 0.0–0.2)

## 2023-01-18 LAB — COMPREHENSIVE METABOLIC PANEL
ALT: 18 U/L (ref 0–44)
AST: 21 U/L (ref 15–41)
Albumin: 3.8 g/dL (ref 3.5–5.0)
Alkaline Phosphatase: 64 U/L (ref 38–126)
Anion gap: 7 (ref 5–15)
BUN: 13 mg/dL (ref 8–23)
CO2: 30 mmol/L (ref 22–32)
Calcium: 8.8 mg/dL — ABNORMAL LOW (ref 8.9–10.3)
Chloride: 107 mmol/L (ref 98–111)
Creatinine, Ser: 0.98 mg/dL (ref 0.44–1.00)
GFR, Estimated: >60 mL/min (ref >60)
Glucose, Bld: 130 mg/dL — ABNORMAL HIGH (ref 70–99)
Potassium: 3.6 mmol/L (ref 3.5–5.1)
Sodium: 144 mmol/L (ref 135–145)
Total Bilirubin: 0.7 mg/dL (ref <1.2)
Total Protein: 7.1 g/dL (ref 6.5–8.1)

## 2023-01-18 LAB — RESP PANEL BY RT-PCR (RSV, FLU A&B, COVID)  RVPGX2
Influenza A by PCR: NEGATIVE
Influenza B by PCR: NEGATIVE
Resp Syncytial Virus by PCR: NEGATIVE
SARS Coronavirus 2 by RT PCR: NEGATIVE

## 2023-01-18 LAB — BLOOD GAS, VENOUS
Acid-Base Excess: 6.4 mmol/L — ABNORMAL HIGH (ref 0.0–2.0)
Bicarbonate: 35.2 mmol/L — ABNORMAL HIGH (ref 20.0–28.0)
O2 Saturation: 65.1 %
Patient temperature: 37
pCO2, Ven: 70 mm[Hg] — ABNORMAL HIGH (ref 44–60)
pH, Ven: 7.31 (ref 7.25–7.43)
pO2, Ven: 37 mm[Hg] (ref 32–45)

## 2023-01-18 LAB — BRAIN NATRIURETIC PEPTIDE: B Natriuretic Peptide: 122.4 pg/mL — ABNORMAL HIGH (ref 0.0–100.0)

## 2023-01-18 LAB — TROPONIN I (HIGH SENSITIVITY): Troponin I (High Sensitivity): 7 ng/L (ref <18)

## 2023-01-18 MED ORDER — IPRATROPIUM BROMIDE 0.02 % IN SOLN
RESPIRATORY_TRACT | Status: AC
  Administered 2023-01-18: 0.5 mg via RESPIRATORY_TRACT
  Filled 2023-01-18: qty 2.5

## 2023-01-18 MED ORDER — IPRATROPIUM BROMIDE 0.02 % IN SOLN
0.5000 mg | Freq: Once | RESPIRATORY_TRACT | Status: AC
  Administered 2023-01-18: 0.5 mg via RESPIRATORY_TRACT
  Filled 2023-01-18: qty 2.5

## 2023-01-18 MED ORDER — ALBUTEROL SULFATE (2.5 MG/3ML) 0.083% IN NEBU: 5.0000 mg | INHALATION_SOLUTION | Freq: Once | RESPIRATORY_TRACT | Status: AC

## 2023-01-18 MED ORDER — ALBUTEROL SULFATE (2.5 MG/3ML) 0.083% IN NEBU
INHALATION_SOLUTION | RESPIRATORY_TRACT | Status: AC
  Administered 2023-01-18: 5 mg via RESPIRATORY_TRACT
  Filled 2023-01-18: qty 6

## 2023-01-18 MED ORDER — ALBUTEROL SULFATE (2.5 MG/3ML) 0.083% IN NEBU
5.0000 mg | INHALATION_SOLUTION | Freq: Once | RESPIRATORY_TRACT | Status: AC
  Administered 2023-01-18: 5 mg via RESPIRATORY_TRACT
  Filled 2023-01-18: qty 6

## 2023-01-18 MED ORDER — IPRATROPIUM BROMIDE 0.02 % IN SOLN: 0.5000 mg | Freq: Once | RESPIRATORY_TRACT | Status: AC

## 2023-01-18 MED ORDER — METHYLPREDNISOLONE SODIUM SUCC 125 MG IJ SOLR
125.0000 mg | Freq: Once | INTRAMUSCULAR | Status: AC
  Administered 2023-01-18: 125 mg via INTRAVENOUS
  Filled 2023-01-18: qty 2

## 2023-01-18 NOTE — ED Triage Notes (Signed)
RA sats 79 with good pleth. Pt does not appear to be in acute distress. Very large body habitus.

## 2023-01-18 NOTE — ED Provider Notes (Signed)
[place] EMERGENCY DEPARTMENT AT [facility] Provider Note   CSN: [id number] Arrival date & time: 01/18/23  1936     History  Chief Complaint  Patient presents with   Shortness of Breath    Renee Durham is a 67 y.o. female.  Patient is a 67 year old female with a history of hypertension, morbid obesity, chronic kidney disease, anemia who typically wears 3 L of oxygen at night to sleep but is presenting today with complaints of new cold-like symptoms that started almost 1 week ago that have persistently worsened and new shortness of breath over the last few days.  Patient did call her doctor yesterday due to thick congestion and shortness of breath and was given a prescription for antibiotics.  She started the antibiotics yesterday and reports that she has had increased sputum production but no fever.  However the shortness of breath worsened today and she could not catch her breath.  She has lymphedema bilateral lower extremities but does not feel it significantly different than prior.  She also reports her son at home has been sick with similar symptoms.  She denies any abdominal pain, chest pain, vomiting or diarrhea.  She does not typically use inhalers at home.  She does not smoke and does report that her son smokes but he smokes outside and not in the home.  The history is provided by the patient and a relative.  Shortness of Breath      Home Medications Prior to Admission medications   Medication Sig Start Date End Date Taking? Authorizing Provider  acetaminophen (TYLENOL) 500 MG tablet Take 500-1,000 mg by mouth every 6 (six) hours as needed for mild pain or moderate pain.    [provider]  acetaminophen (TYLENOL) 500 MG tablet Take by mouth.    [provider]  albuterol (PROVENTIL HFA;VENTOLIN HFA) 108 (90 Base) MCG/ACT inhaler Inhale 2 puffs into the lungs every 6 (six) hours as needed for wheezing or shortness of breath.  11/07/17    [provider]  amLODipine (NORVASC) 10 MG tablet Take 10 mg by mouth every morning.  09/30/13   [provider]  amLODipine (NORVASC) 10 MG tablet Take 1 tablet by mouth daily. 03/17/20   [provider]  amoxicillin-clavulanate (AUGMENTIN) 875-125 MG tablet Take 1 tablet by mouth 2 (two) times daily. 01/17/23 01/24/23  [provider]  aspirin 81 MG EC tablet Take 1 tablet by mouth daily. 05/09/16   [provider]  aspirin EC 81 MG tablet Take 81 mg by mouth daily.  09/08/09   [provider]  baclofen (LIORESAL) 10 MG tablet TAKE 1 TABLET BY MOUTH THREE TIMES A DAY AS NEEDED FOR MUSCLE SPASMS Patient taking differently: Take 10 mg by mouth 3 (three) times daily as needed for muscle spasms. 01/26/18   Hilts, Casimiro Needle, MD  [facility] ASPIRIN PO SMARTSIG:1 Tablet(s) By Mouth Daily 03/13/20   [provider]  diclofenac sodium (VOLTAREN) 1 % GEL Apply 4 g topically 4 (four) times daily as needed. 12/04/17   Hilts, Casimiro Needle, MD  diclofenac Sodium (VOLTAREN) 1 % GEL APPLY 4 G TOPICALLY 4 (FOUR) TIMES DAILY AS NEEDED. 06/07/21   [provider]  ferrous sulfate 325 (65 FE) MG tablet Take by mouth.    [provider]  furosemide (LASIX) 20 MG tablet Take 20 mg by mouth daily.  01/26/17   [provider]  gabapentin (NEURONTIN) 300 MG capsule Take 300 mg by mouth See admin instructions.  Take 1 capsule by mouth every night. Increase 1 capsule by mouth twice a day after 1 week if tolerated. 08/01/22   [provider]  ketoconazole (NIZORAL) 2 % shampoo Apply topically. 10/07/19   [provider]  losartan (COZAAR) 100 MG tablet Take 100 mg by mouth every morning.  09/30/13   [provider]  methocarbamol (ROBAXIN) 500 MG tablet Take 500 mg by mouth daily as needed for muscle spasms.  11/17/14   [provider]  methocarbamol (ROBAXIN) 500 MG tablet Take 1 tablet by mouth daily. 11/17/14   [provider]  metoprolol (TOPROL-XL) 200 MG 24 hr tablet Take 1 tablet by mouth daily. 03/17/20   [provider]  olmesartan (BENICAR) 40 MG tablet Take 1 tablet by mouth daily. 03/30/20   [provider]  oxybutynin (DITROPAN-XL) 10 MG 24 hr tablet Take 10 mg by mouth daily. 07/13/17   [provider]  oxybutynin (DITROPAN-XL) 5 MG 24 hr tablet Take 1 tablet by mouth daily. 11/09/20   [provider]  terbinafine (LAMISIL) 250 MG tablet Take 1 tablet (250 mg total) by mouth daily. 09/24/18   Felecia Shelling, DPM      Allergies    Patient has no known allergies.    Review of Systems   Review of Systems  Respiratory:  Positive for shortness of breath.     Physical Exam Updated Vital Signs BP 138/64   Pulse 67   Temp 98.3 F (36.8 C) (Oral)   Resp 18   Ht 5' 1.5" (1.562 m)   Wt (!) 156.5 kg   SpO2 97%   BMI 64.13 kg/m  Physical Exam Vitals and nursing note reviewed.  Constitutional:      General: She is not in acute distress.    Appearance: She is well-developed.  HENT:     Head: Normocephalic and atraumatic.  Eyes:     Pupils: Pupils are equal, round, and reactive to light.  Cardiovascular:     Rate and Rhythm: Normal rate and regular rhythm.     Heart sounds: Normal heart sounds. No murmur heard.    No friction rub.  Pulmonary:     Effort: Pulmonary effort is normal. Tachypnea present.     Breath sounds: Examination of the right-upper field reveals wheezing. Examination of the left-upper field reveals wheezing. Examination of the right-middle field reveals wheezing. Examination of the left-middle field reveals wheezing. Wheezing present. No rales.  Abdominal:     General: Bowel sounds are normal. There is no distension.     Palpations: Abdomen is soft.     Tenderness: There is no abdominal tenderness. There is no guarding or rebound.  Musculoskeletal:        General: No tenderness. Normal range of motion.     Right lower leg: Edema  present.     Left lower leg: Edema present.     Comments: Significant edema bilateral lower extremities  Skin:    General: Skin is warm and dry.     Findings: No rash.  Neurological:     General: No focal deficit present.     Mental Status: She is alert and oriented to person, place, and time.     Cranial Nerves: No cranial nerve deficit.  Psychiatric:        Behavior: Behavior normal.     ED Results / Procedures / Treatments   Labs (all labs ordered are listed, but only abnormal results are displayed) Labs Reviewed  CBC WITH DIFFERENTIAL/PLATELET - Abnormal; Notable for the following components:      Result Value   Hemoglobin 10.7 (*)    HCT 35.6 (*)    MCH 24.5 (*)    RDW 17.7 (*)    Platelets 140 (*)    Lymphs Abs 0.6 (*)    All other components within normal limits  COMPREHENSIVE METABOLIC PANEL - Abnormal; Notable for the following components:   Glucose, Bld 130 (*)    Calcium 8.8 (*)    All other components within normal limits  BRAIN NATRIURETIC PEPTIDE - Abnormal; Notable for the following components:   B Natriuretic Peptide 122.4 (*)    All other components within normal limits  BLOOD GAS, VENOUS - Abnormal; Notable for the following components:   pCO2, Ven 70 (*)    Bicarbonate 35.2 (*)    Acid-Base Excess 6.4 (*)    All other components within normal limits  RESP PANEL BY RT-PCR (RSV, FLU A&B, COVID)  RVPGX2  TROPONIN I (HIGH SENSITIVITY)    EKG EKG Interpretation Date/Time:  Wednesday January 18 2023 20:02:06 EST Ventricular Rate:  68 PR Interval:    QRS Duration:  91 QT Interval:  417 QTC Calculation: 444 R Axis:   37  Text Interpretation: Sinus rhythm Abnormal R-wave progression, early transition No significant change since last tracing Confirmed by Gwyneth Sprout ([id number]) on 01/18/2023 9:07:46 PM  Radiology DG Chest Port 1 View  Result Date: 01/18/2023 CLINICAL DATA:  sob EXAM: PORTABLE CHEST 1 VIEW COMPARISON:  Chest x-ray 11/13/2018, CT  angio chest 11/13/2018 FINDINGS: Limited evaluation due to overlapping osseous structures and overlying soft tissues. Enlarged cardiac silhouette. The heart and mediastinal contours are unchanged. Prominence of the hilar vasculature. No focal consolidation. No pulmonary edema. Bilateral costophrenic angles are collimated off view. No pleural effusion. No pneumothorax. No acute osseous abnormality. IMPRESSION: Cardiomegaly with central venous congestion. Electronically Signed   By: Tish Frederickson M.D.   On: 01/18/2023 21:31    Procedures Procedures    Medications Ordered in ED Medications  albuterol (PROVENTIL) (2.5 MG/3ML) 0.083% nebulizer solution 5 mg (5 mg Nebulization Given 01/18/23 2017)  ipratropium (ATROVENT) nebulizer solution 0.5 mg (0.5 mg Nebulization Given 01/18/23 2017)  methylPREDNISolone sodium succinate (SOLU-MEDROL) 125 mg/2 mL injection 125 mg (125 mg Intravenous Given 01/18/23 2051)  albuterol (PROVENTIL) (2.5 MG/3ML) 0.083% nebulizer solution 5 mg (5 mg Nebulization Given 01/18/23 2214)  ipratropium (ATROVENT) nebulizer solution 0.5 mg (0.5 mg Nebulization Given 01/18/23 2214)    ED Course/ Medical Decision Making/ A&P                                 Medical Decision Making Amount and/or Complexity of Data Reviewed Labs: ordered. Decision-making details documented in ED Course. Radiology: ordered and independent interpretation performed. Decision-making details documented in ED Course. ECG/medicine tests: ordered and independent interpretation performed. Decision-making details documented in ED Course.  Risk Prescription drug management.   Pt with multiple medical problems and comorbidities and presenting today with a complaint that caries a high risk for morbidity and mortality.  Here today with shortness of breath.  Upon arrival here patient sats were in the 70s with expiratory wheezing.  When she was able to sit in the bed and O2 was increased to 4 L patient sats  eventually came up to 96%.  She is on continuous monitoring and a heart rate and blood pressure are stable.  Patient is able to speak in complete sentences but appears slightly winded.  Concern for bronchitis, pneumonia, viral etiology.  Patient given albuterol, Atrovent and Solu-Medrol. 12:04 AM I independently interpreted patient's labs and EKG which showed sinus rhythm without acute changes.  Viral panel is negative, CBC with stable hemoglobin of 10 and normal white count, CMP without acute findings, BNP and troponin wihtout acute findings.  Venous gas does show a CO2 of 70 with a compensated bicarb and normal pH. I have independently visualized and interpreted pt's images today.  Chest x-ray is technically difficult due to patient's size and being a portable image but does show some cardiomegaly and some venous congestion.  After initial albuterol, Atrovent and steroids patient continues to wheeze still has increased work of breathing but 97% on 4 L.  Will give another treatment.  12:04 AM Patient still wheezing here with some mild tachypnea but reports overall she is starting to feel better.  Given patient's hypoxia, increased oxygen demand and concern for bronchitis will admit for ongoing treatments.  Patient and her family member are comfortable with this plan.  Patient is still mentating normally.          Final Clinical Impression(s) / ED Diagnoses Final diagnoses:  Bronchitis  Acute respiratory failure with hypoxia [facility])    Rx / DC Orders ED Discharge Orders     None         Gwyneth Sprout, MD 01/19/23 0005

## 2023-01-19 DIAGNOSIS — I10 Essential (primary) hypertension: Secondary | ICD-10-CM

## 2023-01-19 DIAGNOSIS — J9621 Acute and chronic respiratory failure with hypoxia: Secondary | ICD-10-CM | POA: Diagnosis not present

## 2023-01-19 DIAGNOSIS — E662 Morbid (severe) obesity with alveolar hypoventilation: Secondary | ICD-10-CM

## 2023-01-19 DIAGNOSIS — J9601 Acute respiratory failure with hypoxia: Secondary | ICD-10-CM | POA: Diagnosis not present

## 2023-01-19 DIAGNOSIS — J9611 Chronic respiratory failure with hypoxia: Secondary | ICD-10-CM

## 2023-01-19 DIAGNOSIS — Z6841 Body Mass Index (BMI) 40.0 and over, adult: Secondary | ICD-10-CM

## 2023-01-19 LAB — RESPIRATORY PANEL BY PCR

## 2023-01-19 LAB — CBC
HCT: 34.1 % — ABNORMAL LOW (ref 36.0–46.0)
Hemoglobin: 10.1 g/dL — ABNORMAL LOW (ref 12.0–15.0)
MCH: 24.3 pg — ABNORMAL LOW (ref 26.0–34.0)
MCHC: 29.6 g/dL — ABNORMAL LOW (ref 30.0–36.0)
MCV: 82 fL (ref 80.0–100.0)
Platelets: 198 10*3/uL (ref 150–400)
RBC: 4.16 MIL/uL (ref 3.87–5.11)
RDW: 17.6 % — ABNORMAL HIGH (ref 11.5–15.5)
WBC: 6.7 10*3/uL (ref 4.0–10.5)
nRBC: 0 % (ref 0.0–0.2)

## 2023-01-19 LAB — BASIC METABOLIC PANEL
Anion gap: 8 (ref 5–15)
BUN: 11 mg/dL (ref 8–23)
CO2: 29 mmol/L (ref 22–32)
Calcium: 8.5 mg/dL — ABNORMAL LOW (ref 8.9–10.3)
Chloride: 107 mmol/L (ref 98–111)
Creatinine, Ser: 0.78 mg/dL (ref 0.44–1.00)
GFR, Estimated: >60 mL/min (ref >60)
Glucose, Bld: 191 mg/dL — ABNORMAL HIGH (ref 70–99)
Potassium: 4.1 mmol/L (ref 3.5–5.1)
Sodium: 144 mmol/L (ref 135–145)

## 2023-01-19 LAB — GLUCOSE, CAPILLARY: Glucose-Capillary: 174 mg/dL — ABNORMAL HIGH (ref 70–99)

## 2023-01-19 LAB — HIV ANTIBODY (ROUTINE TESTING W REFLEX): HIV Screen 4th Generation wRfx: NONREACTIVE

## 2023-01-19 LAB — PROCALCITONIN: Procalcitonin: <0.1 ng/mL

## 2023-01-19 MED ORDER — ACETAMINOPHEN 500 MG PO TABS: 500.0000 mg | ORAL_TABLET | Freq: Four times a day (QID) | ORAL | Status: DC | PRN

## 2023-01-19 MED ORDER — MOMETASONE FURO-FORMOTEROL FUM 200-5 MCG/ACT IN AERO
2.0000 | INHALATION_SPRAY | Freq: Two times a day (BID) | RESPIRATORY_TRACT | Status: DC
  Administered 2023-01-19: 2 via RESPIRATORY_TRACT
  Filled 2023-01-19: qty 8.8

## 2023-01-19 MED ORDER — ENOXAPARIN SODIUM 40 MG/0.4ML IJ SOSY
40.0000 mg | PREFILLED_SYRINGE | INTRAMUSCULAR | Status: DC
  Administered 2023-01-19: 40 mg via SUBCUTANEOUS
  Filled 2023-01-19: qty 0.4

## 2023-01-19 MED ORDER — ALBUTEROL SULFATE (2.5 MG/3ML) 0.083% IN NEBU
15.0000 mg/h | INHALATION_SOLUTION | Freq: Once | RESPIRATORY_TRACT | Status: AC
  Administered 2023-01-19: 15 mg/h via RESPIRATORY_TRACT
  Filled 2023-01-19: qty 3

## 2023-01-19 MED ORDER — GABAPENTIN 300 MG PO CAPS
300.0000 mg | ORAL_CAPSULE | Freq: Two times a day (BID) | ORAL | Status: DC
  Administered 2023-01-19: 300 mg via ORAL
  Filled 2023-01-19: qty 1

## 2023-01-19 MED ORDER — FERROUS SULFATE 325 (65 FE) MG PO TABS
325.0000 mg | ORAL_TABLET | Freq: Every day | ORAL | Status: DC
  Administered 2023-01-19: 325 mg via ORAL
  Filled 2023-01-19: qty 1

## 2023-01-19 MED ORDER — IRBESARTAN 150 MG PO TABS
300.0000 mg | ORAL_TABLET | Freq: Every day | ORAL | Status: DC
  Administered 2023-01-19: 300 mg via ORAL
  Filled 2023-01-19: qty 2

## 2023-01-19 MED ORDER — ASPIRIN 81 MG PO TBEC
81.0000 mg | DELAYED_RELEASE_TABLET | Freq: Every day | ORAL | Status: DC
  Administered 2023-01-19: 81 mg via ORAL
  Filled 2023-01-19: qty 1

## 2023-01-19 MED ORDER — FUROSEMIDE 20 MG PO TABS
20.0000 mg | ORAL_TABLET | Freq: Every day | ORAL | Status: DC
  Administered 2023-01-19: 20 mg via ORAL
  Filled 2023-01-19: qty 1

## 2023-01-19 MED ORDER — ALBUTEROL SULFATE (2.5 MG/3ML) 0.083% IN NEBU: 2.5000 mg | INHALATION_SOLUTION | RESPIRATORY_TRACT | Status: DC | PRN

## 2023-01-19 MED ORDER — AMLODIPINE BESYLATE 10 MG PO TABS
10.0000 mg | ORAL_TABLET | Freq: Every morning | ORAL | Status: DC
  Administered 2023-01-19: 10 mg via ORAL
  Filled 2023-01-19: qty 1

## 2023-01-19 MED ORDER — METOPROLOL SUCCINATE ER 50 MG PO TB24
200.0000 mg | ORAL_TABLET | Freq: Every day | ORAL | Status: DC
  Administered 2023-01-19: 200 mg via ORAL
  Filled 2023-01-19: qty 4

## 2023-01-19 MED ORDER — PREDNISONE 20 MG PO TABS
40.0000 mg | ORAL_TABLET | Freq: Every day | ORAL | Status: DC
  Administered 2023-01-19: 40 mg via ORAL
  Filled 2023-01-19: qty 2

## 2023-01-19 MED ORDER — PREDNISONE 20 MG PO TABS: 40.0000 mg | ORAL_TABLET | Freq: Every day | ORAL | 0 refills | Status: AC

## 2023-01-19 NOTE — Progress Notes (Signed)
Called [phone] again for report with no answer.

## 2023-01-19 NOTE — Assessment & Plan Note (Addendum)
SW consult to see if theres any chance insurance will consider covering Wegovy or Zepbound.

## 2023-01-19 NOTE — Assessment & Plan Note (Addendum)
VBG today shows compensated respiratory acidosis with CO2 of 70, bicarb 35. 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

## 2023-01-19 NOTE — TOC Transition Note (Signed)
Transition of Care [facility]) - CM/SW Discharge Note   Patient Details  Name: Renee Durham MRN: [id number] Date of Birth: [birth year]  Transition of [facility]) CM/SW Contact:  Amada Jupiter, LCSW Phone Number: 01/19/2023, 3:31 PM   Clinical Narrative:     Met with pt to review dc needs.  Pt confirms that she has O2 already in the home from [facility] but no portable tanks left.  Have contacted [employer] and they are to deliver a portable tank to pt's room today for use during transport home as well as additional tanks to the home.   No further TOC needs.    Final next level of care: Home/Self Care Barriers to Discharge: No Barriers Identified   Patient Goals and CMS Choice      Discharge Placement                         Discharge Plan and Services Additional resources added to the After Visit Summary for                  DME Arranged: N/A DME Agency: NA                  Social Determinants of Health (SDOH) Interventions SDOH Screenings   Food Insecurity: No Food Insecurity (01/19/2023)  Housing: Medium Risk (01/19/2023)  Transportation Needs: No Transportation Needs (01/19/2023)  Utilities: Not At Risk (01/19/2023)  Tobacco Use: Low Risk  (01/18/2023)     Readmission Risk Interventions     No data to display

## 2023-01-19 NOTE — Assessment & Plan Note (Signed)
Wears 3L at night it sounds like.

## 2023-01-19 NOTE — Evaluation (Signed)
Occupational Therapy Evaluation Patient Details Name: Renee Durham MRN: [id number] DOB: 15-Jun-1955 Today's Date: 01/19/2023   History of Present Illness 67 y.o. female with medical history significant of Morbid obesity, HTN, chronic hypoxic resp failure on 3L O2 at night to sleep came into the ED with 1 week symptoms of cough and shortness of breath.   Clinical Impression   Patient admitted for the diagnosis above.  PTA she lives in a one level apartment with her son.  Family does provide assist as needed from sin and daughter.  Currently she is still experiencing SOB, but states her activity tolerance is much improved.  Patient is planning on discharging home today with prior level of supports from family, no further OT needs exist in the acute setting.         If plan is discharge home, recommend the following: Assist for transportation;Assistance with cooking/housework    Functional Status Assessment  Patient has had a recent decline in their functional status and demonstrates the ability to make significant improvements in function in a reasonable and predictable amount of time.  Equipment Recommendations  None recommended by OT    Recommendations for Other Services       Precautions / Restrictions Precautions Precautions: Fall Restrictions Weight Bearing Restrictions: No Other Position/Activity Restrictions: Chronic O2      Mobility Bed Mobility               General bed mobility comments: up in the recliner Patient Response: Cooperative  Transfers Overall transfer level: Needs assistance Equipment used: Rolling walker (2 wheels) Transfers: Sit to/from Stand, Bed to chair/wheelchair/BSC Sit to Stand: Supervision     Step pivot transfers: Supervision            Balance Overall balance assessment: Needs assistance Sitting-balance support: Feet supported Sitting balance-Leahy Scale: Good     Standing balance support: Reliant on assistive device  for balance Standing balance-Leahy Scale: Poor                             ADL either performed or assessed with clinical judgement   ADL       Grooming: Wash/dry hands;Wash/dry face;Set up;Sitting           Upper Body Dressing : Set up;Sitting   Lower Body Dressing: Minimal assistance;Sit to/from stand   Toilet Transfer: Supervision/safety;Ambulation;Rolling walker (2 wheels)                   Vision Patient Visual Report: No change from baseline       Perception Perception: Not tested       Praxis Praxis: Not tested       Pertinent Vitals/Pain Pain Assessment Pain Assessment: Faces Faces Pain Scale: Hurts a little bit Pain Location: knees Pain Descriptors / Indicators: Sore Pain Intervention(s): Monitored during session     Extremity/Trunk Assessment Upper Extremity Assessment Upper Extremity Assessment: Overall WFL for tasks assessed   Lower Extremity Assessment Lower Extremity Assessment: Defer to PT evaluation   Cervical / Trunk Assessment Cervical / Trunk Assessment: Other exceptions Cervical / Trunk Exceptions: body habitus   Communication Communication Communication: No apparent difficulties   Cognition Arousal: Alert Behavior During Therapy: WFL for tasks assessed/performed Overall Cognitive Status: Within Functional Limits for tasks assessed  General Comments   O2 96% on 3L    Exercises     Shoulder Instructions      Home Living Family/patient expects to be discharged to:: Private residence Living Arrangements: Children Available Help at Discharge: Family Type of Home: Apartment Home Access: Level entry     Home Layout: One level     Bathroom Shower/Tub: Chief Strategy Officer: Standard     Home Equipment: Agricultural consultant (2 wheels);Rollator (4 wheels);Shower seat          Prior Functioning/Environment Prior Level of Function :  Independent/Modified Independent             Mobility Comments: reports she is ambulatory around household, needs assist for getting into bed due to height ADLs Comments: Continues to perform her own ADL and assists with iADL, but son completes most of household iADL.  No longer drives        OT Problem List: Decreased activity tolerance      OT Treatment/Interventions:      OT Goals(Current goals can be found in the care plan section) Acute Rehab OT Goals Patient Stated Goal: Going home today OT Goal Formulation: With patient Time For Goal Achievement: 02/02/23 Potential to Achieve Goals: Good  OT Frequency:      Co-evaluation              AM-PAC OT "6 Clicks" Daily Activity     Outcome Measure Help from another person eating meals?: None Help from another person taking care of personal grooming?: None Help from another person toileting, which includes using toliet, bedpan, or urinal?: A Little Help from another person bathing (including washing, rinsing, drying)?: A Little Help from another person to put on and taking off regular upper body clothing?: A Little Help from another person to put on and taking off regular lower body clothing?: A Little 6 Click Score: 20   End of Session Nurse Communication: Mobility status  Activity Tolerance: Patient tolerated treatment well Patient left: in chair;with call bell/phone within reach  OT Visit Diagnosis: Muscle weakness (generalized) (M62.81)                Time: 8295-6213 OT Time Calculation (min): 19 min Charges:  OT General Charges $OT Visit: 1 Visit OT Evaluation $OT Eval Moderate Complexity: 1 Mod  01/19/2023  RP, OTR/L  Acute Rehabilitation Services  Office:  [phone]   Suzanna Obey 01/19/2023, 11:47 AM

## 2023-01-19 NOTE — Progress Notes (Signed)
Called [phone] for report with no answer. Will attempt to call again.

## 2023-01-19 NOTE — Progress Notes (Signed)
SATURATION QUALIFICATIONS: (This note is used to comply with regulatory documentation for home oxygen)  Patient Saturations on Room Air at Rest = 98%  Patient Saturations on Room Air while Ambulating = 85%  Patient Saturations on 2 Liters of oxygen while Ambulating = 95%  Please briefly explain why patient needs home oxygen: Patient was admitted due to Acute Respiratory failure. Patient states she is on # liters of oxygen at night.

## 2023-01-19 NOTE — ED Notes (Signed)
ED TO INPATIENT HANDOFF REPORT  Name/Age/Gender Renee Durham 67 y.o. female  Code Status    Code Status Orders  (From admission, onward)           Start     Ordered   01/19/23 0127  Full code  Continuous       Question:  By:  Answer:  Consent: discussion documented in EHR   01/19/23 0135           Code Status History     Date Active Date Inactive Code Status Order ID Comments User Context   11/12/2018 1816 11/15/2018 2202 Full Code [id number]  Alwyn Ren, MD ED   11/12/2018 1814 11/12/2018 1816 Full Code [id number]  Alwyn Ren, MD ED       Home/SNF/Other Home  Chief Complaint Acute respiratory failure with hypoxia (HCC) [J96.01]  Level of Care/Admitting Diagnosis ED Disposition     ED Disposition  Admit   Condition  --   Comment  Hospital Area: [facility] [[id number]]  Level of Care: Med-Surg [16]  May place patient in observation at [facility] or [facility] if equivalent level of care is available:: No  Covid Evaluation: Asymptomatic - no recent exposure (last 10 days) testing not required  Diagnosis: Acute respiratory failure with hypoxia [facility]) [[id number]]  Admitting Physician: Hillary Bow [[id number]]  Attending Physician: Hillary Bow [phone]          Medical History Past Medical History:  Diagnosis Date   Anemia    Arthritis    knees/back   Chronic kidney disease    H/O sickle cell trait    Hypertension    Obesities, morbid (HCC)     Allergies No Known Allergies  IV Location/Drains/Wounds Patient Lines/Drains/Airways Status     Active Line/Drains/Airways     Name Placement date Placement time Site Days   Peripheral IV 01/18/23 18 G Left Antecubital 01/18/23  2020  Antecubital  1   External Urinary Catheter 01/18/23  2124  --  1            Labs/Imaging Results for orders placed or performed during the hospital encounter of 01/18/23 (from the past 48 hour(s))  CBC with  Differential/Platelet     Status: Abnormal   Collection Time: 01/18/23  8:21 PM  Result Value Ref Range   WBC 7.9 4.0 - 10.5 K/uL   RBC 4.36 3.87 - 5.11 MIL/uL   Hemoglobin 10.7 (L) 12.0 - 15.0 g/dL   HCT 63.0 (L) 16.0 - 10.9 %   MCV 81.7 80.0 - 100.0 fL   MCH 24.5 (L) 26.0 - 34.0 pg   MCHC 30.1 30.0 - 36.0 g/dL   RDW 32.3 (H) 55.7 - 32.2 %   Platelets 140 (L) 150 - 400 K/uL   nRBC 0.0 0.0 - 0.2 %   Neutrophils Relative % 80 %   Neutro Abs 6.3 1.7 - 7.7 K/uL   Lymphocytes Relative 7 %   Lymphs Abs 0.6 (L) 0.7 - 4.0 K/uL   Monocytes Relative 9 %   Monocytes Absolute 0.7 0.1 - 1.0 K/uL   Eosinophils Relative 3 %   Eosinophils Absolute 0.2 0.0 - 0.5 K/uL   Basophils Relative 1 %   Basophils Absolute 0.0 0.0 - 0.1 K/uL   Immature Granulocytes 0 %   Abs Immature Granulocytes 0.02 0.00 - 0.07 K/uL    Comment: Performed at [facility], [address], [place], [place] [address]  Comprehensive metabolic panel     Status: Abnormal   Collection Time: 01/18/23  8:21 PM  Result Value Ref Range   Sodium 144 135 - 145 mmol/L   Potassium 3.6 3.5 - 5.1 mmol/L   Chloride 107 98 - 111 mmol/L   CO2 30 22 - 32 mmol/L   Glucose, Bld 130 (H) 70 - 99 mg/dL    Comment: Glucose reference range applies only to samples taken after fasting for at least 8 hours.   BUN 13 8 - 23 mg/dL   Creatinine, Ser 4.25 0.44 - 1.00 mg/dL   Calcium 8.8 (L) 8.9 - 10.3 mg/dL   Total Protein 7.1 6.5 - 8.1 g/dL   Albumin 3.8 3.5 - 5.0 g/dL   AST 21 15 - 41 U/L   ALT 18 0 - 44 U/L   Alkaline Phosphatase 64 38 - 126 U/L   Total Bilirubin 0.7 <1.2 mg/dL   GFR, Estimated >95 >63 mL/min    Comment: (NOTE) Calculated using the CKD-EPI Creatinine Equation (2021)    Anion gap 7 5 - 15    Comment: Performed at [facility], [address], [place], [place] [address]  Brain natriuretic peptide     Status: Abnormal   Collection Time: 01/18/23  8:21 PM  Result Value Ref Range   B  Natriuretic Peptide 122.4 (H) 0.0 - 100.0 pg/mL    Comment: Performed at [facility], [address], [place], [place] [address]  Troponin I (High Sensitivity)     Status: None   Collection Time: 01/18/23  8:21 PM  Result Value Ref Range   Troponin I (High Sensitivity) 7 <18 ng/L    Comment: (NOTE) Elevated high sensitivity troponin I (hsTnI) values and significant  changes across serial measurements may suggest ACS but many other  chronic and acute conditions are known to elevate hsTnI results.  Refer to the "Links" section for chest pain algorithms and additional  guidance. Performed at [facility] [address], [place], [place] [address]   Resp panel by RT-PCR (RSV, Flu A&B, Covid) Anterior Nasal Swab     Status: None   Collection Time: 01/18/23  8:39 PM   Specimen: Anterior Nasal Swab  Result Value Ref Range   SARS Coronavirus 2 by RT PCR NEGATIVE NEGATIVE    Comment: (NOTE) SARS-CoV-2 target nucleic acids are NOT DETECTED.  The SARS-CoV-2 RNA is generally detectable in upper respiratory specimens during the acute phase of infection. The lowest concentration of SARS-CoV-2 viral copies this assay can detect is 138 copies/mL. A negative result does not preclude SARS-Cov-2 infection and should not be used as the sole basis for treatment or other patient management decisions. A negative result may occur with  improper specimen collection/handling, submission of specimen other than nasopharyngeal swab, presence of viral mutation(s) within the areas targeted by this assay, and inadequate number of viral copies(<138 copies/mL). A negative result must be combined with clinical observations, patient history, and epidemiological information. The expected result is Negative.  Fact Sheet for Patients:  BloggerCourse.com  Fact Sheet for Healthcare Providers:  SeriousBroker.it  This test is no  t yet approved or cleared by the [place] FDA and  has been authorized for detection and/or diagnosis of SARS-CoV-2 by FDA under an Emergency Use Authorization (EUA). This EUA will remain  in effect (meaning this test can be used) for the duration of the COVID-19 declaration under Section 564(b)(1) of the Act, 21 U.S.C.section 360bbb-3(b)(1), unless  the authorization is terminated  or revoked sooner.       Influenza A by PCR NEGATIVE NEGATIVE   Influenza B by PCR NEGATIVE NEGATIVE    Comment: (NOTE) The Xpert Xpress SARS-CoV-2/FLU/RSV plus assay is intended as an aid in the diagnosis of influenza from Nasopharyngeal swab specimens and should not be used as a sole basis for treatment. Nasal washings and aspirates are unacceptable for Xpert Xpress SARS-CoV-2/FLU/RSV testing.  Fact Sheet for Patients: BloggerCourse.com  Fact Sheet for Healthcare Providers: SeriousBroker.it  This test is not yet approved or cleared by the [place] FDA and has been authorized for detection and/or diagnosis of SARS-CoV-2 by FDA under an Emergency Use Authorization (EUA). This EUA will remain in effect (meaning this test can be used) for the duration of the COVID-19 declaration under Section 564(b)(1) of the Act, 21 U.S.C. section 360bbb-3(b)(1), unless the authorization is terminated or revoked.     Resp Syncytial Virus by PCR NEGATIVE NEGATIVE    Comment: (NOTE) Fact Sheet for Patients: BloggerCourse.com  Fact Sheet for Healthcare Providers: SeriousBroker.it  This test is not yet approved or cleared by the [place] FDA and has been authorized for detection and/or diagnosis of SARS-CoV-2 by FDA under an Emergency Use Authorization (EUA). This EUA will remain in effect (meaning this test can be used) for the duration of the COVID-19 declaration under Section 564(b)(1) of the Act, 21  U.S.C. section 360bbb-3(b)(1), unless the authorization is terminated or revoked.  Performed at [facility], [address], [place], [place] [address]   Blood gas, venous (at [facility])     Status: Abnormal   Collection Time: 01/18/23 10:30 PM  Result Value Ref Range   pH, Ven 7.31 7.25 - 7.43   pCO2, Ven 70 (H) 44 - 60 mmHg   pO2, Ven 37 32 - 45 mmHg   Bicarbonate 35.2 (H) 20.0 - 28.0 mmol/L   Acid-Base Excess 6.4 (H) 0.0 - 2.0 mmol/L   O2 Saturation 65.1 %   Patient temperature 37.0     Comment: Performed at [facility] [address], [place], [place] [address]   DG Chest Port 1 View  Result Date: 01/18/2023 CLINICAL DATA:  sob EXAM: PORTABLE CHEST 1 VIEW COMPARISON:  Chest x-ray 11/13/2018, CT angio chest 11/13/2018 FINDINGS: Limited evaluation due to overlapping osseous structures and overlying soft tissues. Enlarged cardiac silhouette. The heart and mediastinal contours are unchanged. Prominence of the hilar vasculature. No focal consolidation. No pulmonary edema. Bilateral costophrenic angles are collimated off view. No pleural effusion. No pneumothorax. No acute osseous abnormality. IMPRESSION: Cardiomegaly with central venous congestion. Electronically Signed   By: Tish Frederickson M.D.   On: 01/18/2023 21:31    Pending Labs Unresulted Labs (From admission, onward)     Start     Ordered   01/19/23 0500  CBC  Tomorrow morning,   R        01/19/23 0135   01/19/23 0500  Basic metabolic panel  Tomorrow morning,   R        01/19/23 0135   01/19/23 0145  Procalcitonin  Once,   R       References:    Procalcitonin Lower Respiratory Tract Infection AND Sepsis Procalcitonin Algorithm   01/19/23 0144   01/19/23 0127  HIV Antibody (routine testing w rflx)  (HIV Antibody (Routine testing w reflex) panel)  Once,   R        01/19/23 0135  01/19/23 0102  Respiratory (~20 pathogens) panel by PCR  (Respiratory panel by PCR (~20 pathogens, ~24 hr  TAT)  w precautions)  Once,   URGENT        01/19/23 0101            Vitals/Pain Today's Vitals   01/19/23 0049 01/19/23 0100 01/19/23 0130 01/19/23 0200  BP:  131/76 137/68 (!) 142/81  Pulse:  69 64 79  Resp:  (!) 21 (!) 24 (!) 33  Temp:    98.3 F (36.8 C)  TempSrc:      SpO2: 98% 100% 98% 96%  Weight:      Height:      PainSc:        Isolation Precautions Droplet precaution  Medications Medications  enoxaparin (LOVENOX) injection 40 mg (has no administration in time range)  predniSONE (DELTASONE) tablet 40 mg (has no administration in time range)  mometasone-formoterol (DULERA) 200-5 MCG/ACT inhaler 2 puff (has no administration in time range)  albuterol (PROVENTIL) (2.5 MG/3ML) 0.083% nebulizer solution 2.5 mg (has no administration in time range)  albuterol (PROVENTIL) (2.5 MG/3ML) 0.083% nebulizer solution 5 mg (5 mg Nebulization Given 01/18/23 2017)  ipratropium (ATROVENT) nebulizer solution 0.5 mg (0.5 mg Nebulization Given 01/18/23 2017)  methylPREDNISolone sodium succinate (SOLU-MEDROL) 125 mg/2 mL injection 125 mg (125 mg Intravenous Given 01/18/23 2051)  albuterol (PROVENTIL) (2.5 MG/3ML) 0.083% nebulizer solution 5 mg (5 mg Nebulization Given 01/18/23 2214)  ipratropium (ATROVENT) nebulizer solution 0.5 mg (0.5 mg Nebulization Given 01/18/23 2214)  albuterol (PROVENTIL) (2.5 MG/3ML) 0.083% nebulizer solution (15 mg/hr Nebulization Given 01/19/23 0049)    Mobility walks with device

## 2023-01-19 NOTE — Assessment & Plan Note (Signed)
Med rec pending. Plan to continue home meds once completed.

## 2023-01-19 NOTE — Assessment & Plan Note (Addendum)
Acute bronchitis with acute hypoxic resp failure. Likely viral given that son at home with similar symptoms DDx includes bacterial PNA vs CHF. Covid, flu, RSV neg Check RVP COPD pathway prednisone LABA/LAMA and INH steroid PRN SABA Check procalcitonin; holding off on ABx for the moment.

## 2023-01-19 NOTE — H&P (Addendum)
History and Physical    Patient: Renee Durham LKG:[id number] DOB: 1955-11-07 DOA: 01/18/2023 DOS: the patient was seen and examined on 01/19/2023 PCP: Verlon Au, MD  Patient coming from: Home  Chief Complaint:  Chief Complaint  Patient presents with   Shortness of Breath   HPI: Renee Durham is a 67 y.o. female with medical history significant of Morbid obesity, HTN, chronic hypoxic resp failure on 3L O2 at night to sleep.  Pt in to ED today with URI symptoms, onset 1 week ago, persistently worsened, new SOB over past few days.  Patient did call her doctor yesterday due to thick congestion and shortness of breath and was given a prescription for antibiotics. She started the antibiotics yesterday and reports that she has had increased sputum production but no fever. However the shortness of breath worsened today and she could not catch her breath. She has lymphedema bilateral lower extremities but does not feel it significantly different than prior. She also reports her son at home has been sick with similar symptoms. She denies any abdominal pain, chest pain, vomiting or diarrhea. She does not typically use inhalers at home. She does not smoke and does report that her son smokes but he smokes outside and not in the home.     Review of Systems: As mentioned in the history of present illness. All other systems reviewed and are negative. Past Medical History:  Diagnosis Date   Anemia    Arthritis    knees/back   Chronic kidney disease    H/O sickle cell trait    Hypertension    Obesities, morbid (HCC)    Past Surgical History:  Procedure Laterality Date   BIOPSY  08/08/2017   Procedure: BIOPSY;  Surgeon: Beverley Fiedler, MD;  Location: [facility];  Service: Gastroenterology;;   BREAST CYST EXCISION     COLONOSCOPY N/A 04/21/2014   Procedure: COLONOSCOPY;  Surgeon: Beverley Fiedler, MD;  Location: [facility];  Service: Gastroenterology;  Laterality: N/A;   COLONOSCOPY  WITH PROPOFOL N/A 08/08/2017   Procedure: COLONOSCOPY WITH PROPOFOL;  Surgeon: Beverley Fiedler, MD;  Location: [facility];  Service: Gastroenterology;  Laterality: N/A;   EXTREMITY CYST EXCISION Right    right wrist   HERNIA REPAIR     umbilical   KNEE SURGERY     scope   POLYPECTOMY     POLYPECTOMY  08/08/2017   Procedure: POLYPECTOMY;  Surgeon: Beverley Fiedler, MD;  Location: [facility];  Service: Gastroenterology;;   uterine ablation     Social History:  reports that she has never smoked. She has never been exposed to tobacco smoke. She has never used smokeless tobacco. She reports that she does not drink alcohol and does not use drugs.  No Known Allergies  Family History  Problem Relation Age of Onset   Breast cancer Sister    Breast cancer Sister    Colon cancer Neg Hx    Esophageal cancer Neg Hx    Rectal cancer Neg Hx    Stomach cancer Neg Hx     Prior to Admission medications   Medication Sig Start Date End Date Taking? Authorizing Provider  acetaminophen (TYLENOL) 500 MG tablet Take 500-1,000 mg by mouth every 6 (six) hours as needed for mild pain or moderate pain.    [provider]  acetaminophen (TYLENOL) 500 MG tablet Take by mouth.    [provider]  albuterol (PROVENTIL HFA;VENTOLIN HFA) 108 (90 Base) MCG/ACT inhaler Inhale  2 puffs into the lungs every 6 (six) hours as needed for wheezing or shortness of breath.  11/07/17   [provider]  amLODipine (NORVASC) 10 MG tablet Take 10 mg by mouth every morning.  09/30/13   [provider]  amLODipine (NORVASC) 10 MG tablet Take 1 tablet by mouth daily. 03/17/20   [provider]  amoxicillin-clavulanate (AUGMENTIN) 875-125 MG tablet Take 1 tablet by mouth 2 (two) times daily. 01/17/23 01/24/23  [provider]  aspirin 81 MG EC tablet Take 1 tablet by mouth daily. 05/09/16   [provider]  aspirin EC 81 MG tablet Take 81 mg by mouth daily.  09/08/09   [provider]  baclofen (LIORESAL) 10 MG tablet TAKE 1 TABLET BY MOUTH THREE TIMES A DAY AS NEEDED FOR MUSCLE SPASMS Patient taking differently: Take 10 mg by mouth 3 (three) times daily as needed for muscle spasms. 01/26/18   Hilts, Casimiro Needle, MD  [facility] ASPIRIN PO SMARTSIG:1 Tablet(s) By Mouth Daily 03/13/20   [provider]  diclofenac sodium (VOLTAREN) 1 % GEL Apply 4 g topically 4 (four) times daily as needed. 12/04/17   Hilts, Casimiro Needle, MD  diclofenac Sodium (VOLTAREN) 1 % GEL APPLY 4 G TOPICALLY 4 (FOUR) TIMES DAILY AS NEEDED. 06/07/21   [provider]  ferrous sulfate 325 (65 FE) MG tablet Take by mouth.    [provider]  furosemide (LASIX) 20 MG tablet Take 20 mg by mouth daily.  01/26/17   [provider]  gabapentin (NEURONTIN) 300 MG capsule Take 300 mg by mouth See admin instructions. Take 1 capsule by mouth every night. Increase 1 capsule by mouth twice a day after 1 week if tolerated. 08/01/22   [provider]  ketoconazole (NIZORAL) 2 % shampoo Apply topically. 10/07/19   [provider]  losartan (COZAAR) 100 MG tablet Take 100 mg by mouth every morning.  09/30/13   [provider]  methocarbamol (ROBAXIN) 500 MG tablet Take 500 mg by mouth daily as needed for muscle spasms.  11/17/14   [provider]  methocarbamol (ROBAXIN) 500 MG tablet Take 1 tablet by mouth daily. 11/17/14   [provider]  metoprolol (TOPROL-XL) 200 MG 24 hr tablet Take 1 tablet by mouth daily. 03/17/20   [provider]  olmesartan (BENICAR) 40 MG tablet Take 1 tablet by mouth daily. 03/30/20   [provider]  oxybutynin (DITROPAN-XL) 10 MG 24 hr tablet Take 10 mg by mouth daily. 07/13/17   [provider]  oxybutynin (DITROPAN-XL) 5 MG 24 hr tablet Take 1 tablet by mouth daily. 11/09/20   [provider]  terbinafine (LAMISIL) 250 MG tablet Take 1 tablet (250 mg total) by mouth daily. 09/24/18   Felecia Shelling, DPM    Physical Exam: Vitals:   01/19/23 0000 01/19/23 0030 01/19/23 0049 01/19/23 0100  BP: (!) 153/71 (!) 154/71  131/76  Pulse: 75 70  69  Resp: (!) 22 17  (!) 21  Temp:      TempSrc:      SpO2: 98% 97% 98% 100%  Weight:      Height:       Constitutional: NAD, calm, comfortable Respiratory: Wheezing Cardiovascular: Regular rate and rhythm, no murmurs / rubs / gallops. No extremity edema. 2+ pedal pulses. No carotid bruits.  Abdomen: no tenderness, no masses palpated. No hepatosplenomegaly. Bowel sounds positive.  Neurologic: CN 2-12 grossly intact. Sensation intact, DTR normal. Strength 5/5 in all  4.  Psychiatric: Normal judgment and insight. Alert and oriented x 3. Normal mood.   Data Reviewed:    Labs on Admission: I have personally reviewed following labs and imaging studies  CBC: Recent Labs  Lab 01/18/23 2021  WBC 7.9  NEUTROABS 6.3  HGB 10.7*  HCT 35.6*  MCV 81.7  PLT 140*   Basic Metabolic Panel: Recent Labs  Lab 01/18/23 2021  NA 144  K 3.6  CL 107  CO2 30  GLUCOSE 130*  BUN 13  CREATININE 0.98  CALCIUM 8.8*   GFR: Estimated Creatinine Clearance: 83.1 mL/min (by C-G formula based on SCr of 0.98 mg/dL). Liver Function Tests: Recent Labs  Lab 01/18/23 2021  AST 21  ALT 18  ALKPHOS 64  BILITOT 0.7  PROT 7.1  ALBUMIN 3.8   No results for input(s): "LIPASE", "AMYLASE" in the last 168 hours. No results for input(s): "AMMONIA" in the last 168 hours. Coagulation Profile: No results for input(s): "INR", "PROTIME" in the last 168 hours. Cardiac Enzymes: No results for input(s): "CKTOTAL", "CKMB", "CKMBINDEX", "TROPONINI" in the last 168 hours. BNP (last 3 results) No results for input(s): "PROBNP" in the last 8760 hours. HbA1C: No results for input(s): "HGBA1C" in the last 72 hours. CBG: No results for input(s): "GLUCAP" in the last 168 hours. Lipid Profile: No results for input(s): "CHOL", "HDL", "LDLCALC", "TRIG", "CHOLHDL",  "LDLDIRECT" in the last 72 hours. Thyroid Function Tests: No results for input(s): "TSH", "T4TOTAL", "FREET4", "T3FREE", "THYROIDAB" in the last 72 hours. Anemia Panel: No results for input(s): "VITAMINB12", "FOLATE", "FERRITIN", "TIBC", "IRON", "RETICCTPCT" in the last 72 hours. Urine analysis: No results found for: "COLORURINE", "APPEARANCEUR", "LABSPEC", "PHURINE", "GLUCOSEU", "HGBUR", "BILIRUBINUR", "KETONESUR", "PROTEINUR", "UROBILINOGEN", "NITRITE", "LEUKOCYTESUR"  Radiological Exams on Admission: DG Chest Port 1 View  Result Date: 01/18/2023 CLINICAL DATA:  sob EXAM: PORTABLE CHEST 1 VIEW COMPARISON:  Chest x-ray 11/13/2018, CT angio chest 11/13/2018 FINDINGS: Limited evaluation due to overlapping osseous structures and overlying soft tissues. Enlarged cardiac silhouette. The heart and mediastinal contours are unchanged. Prominence of the hilar vasculature. No focal consolidation. No pulmonary edema. Bilateral costophrenic angles are collimated off view. No pleural effusion. No pneumothorax. No acute osseous abnormality. IMPRESSION: Cardiomegaly with central venous congestion. Electronically Signed   By: Tish Frederickson M.D.   On: 01/18/2023 21:31    EKG: Independently reviewed.   Assessment and Plan: * Acute respiratory failure with hypoxia (HCC) Acute bronchitis with acute hypoxic resp failure. Likely viral given that son at home with similar symptoms DDx includes bacterial PNA vs CHF. Covid, flu, RSV neg Check RVP COPD pathway prednisone LABA/LAMA and INH steroid PRN SABA Check procalcitonin; holding off on ABx for the moment.  Chronic respiratory failure with hypoxia (HCC) Wears 3L at night it sounds like.  Obesity with alveolar hypoventilation and body mass index (BMI) of 40 or greater (HCC) VBG today shows compensated respiratory acidosis with CO2 of 70, bicarb 35. 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  Essential hypertension Med rec  pending. Plan to continue home meds once completed.  BMI 60.0-69.9, adult [facility]) SW consult to see if theres any chance insurance will consider covering Wegovy or Zepbound.      Advance Care Planning:   Code Status: Full Code  Consults: None  Family Communication: No family in room  Severity of Illness: The appropriate patient status for this patient is OBSERVATION. Observation status is judged to be reasonable and necessary in order to provide the required  intensity of service to ensure the patient's safety. The patient's presenting symptoms, physical exam findings, and initial radiographic and laboratory data in the context of their medical condition is felt to place them at decreased risk for further clinical deterioration. Furthermore, it is anticipated that the patient will be medically stable for discharge from the hospital within 2 midnights of admission.   Author: Hillary Bow., DO 01/19/2023 1:44 AM  For on call review [email or web address].

## 2023-01-19 NOTE — Discharge Summary (Signed)
Physician Discharge Summary  Renee Durham:[id number] DOB: [birth year] DOA: 01/18/2023  PCP: Verlon Au, MD  Admit date: 01/18/2023 Discharge date: 01/19/2023    Admitted From: Home Disposition: Home  Recommendations for Outpatient Follow-up:  Follow up with PCP in 1-2 weeks Please obtain BMP/CBC in one week Please follow up with your PCP on the following pending results: Unresulted Labs (From admission, onward)     Start     Ordered   01/19/23 0145  Procalcitonin  Once,   R       References:    Procalcitonin Lower Respiratory Tract Infection AND Sepsis Procalcitonin Algorithm   01/19/23 0144   01/19/23 0127  HIV Antibody (routine testing w rflx)  (HIV Antibody (Routine testing w reflex) panel)  Once,   R        01/19/23 0135              Home Health: None Equipment/Devices: None  Discharge Condition: Stable CODE STATUS: Full code Diet recommendation: Cardiac  Subjective: Seen and examined.  She says that she feels a lot better.  No shortness of breath.  She is in agreement with discharging home today.  Brief/Interim Summary: Renee Durham is a 67 y.o. female with medical history significant of Morbid obesity, HTN, chronic hypoxic resp failure on 3L O2 at night to sleep came into the ED with 1 week symptoms of cough and shortness of breath.   Patient did call her doctor yesterday due to thick congestion and shortness of breath and was given a prescription for antibiotics. She started the antibiotics a day before presentation and reports that she has had increased sputum production but no fever. However the shortness of breath worsened so she decided to come to the ED.  No chest pain.  She was noted to have some wheezes upon examination of the admitting hospitalist, was started on prednisone.  Chest x-ray was unremarkable for infiltrates or pneumonia.  However respiratory viral panel was positive for rhinovirus.  Patient does use 3 L of oxygen at night and  she has been placed on 3 L of oxygen here but saturating 99% indicating that she does not really need the oxygen during the daytime.  Acute respiratory failure with hypoxia is ruled out and chronic hypoxic respiratory failure is ruled in.  Since this is acute viral bronchitis, no antibiotics indicated however she did have mild wheezes so I will continue prednisone for 4 days.  I will however continue PTA Augmentin that her PCP had started.  Her renal function and CBC is within normal range today and she is stable so she is going to be discharged home.  She does not have any history of COPD and I do not think she has COPD exacerbation and instead she may have a component of obesity hypoventilation syndrome.  Super morbid obesity type III: Weight loss and diet modification counseled.  Discharge plan was discussed with patient and/or family member and they verbalized understanding and agreed with it.  Discharge Diagnoses:  Principal Problem:   Acute respiratory failure with hypoxia (HCC) Active Problems:   Chronic respiratory failure with hypoxia (HCC)   BMI 60.0-69.9, adult (HCC)   Essential hypertension   Obesity with alveolar hypoventilation and body mass index (BMI) of 40 or greater [facility])    Discharge Instructions   Allergies as of 01/19/2023   No Known Allergies      Medication List     STOP taking these medications    baclofen  10 MG tablet Commonly known as: LIORESAL   terbinafine 250 MG tablet Commonly known as: LamISIL       TAKE these medications    acetaminophen 500 MG tablet Commonly known as: TYLENOL Take 500-1,000 mg by mouth every 6 (six) hours as needed for mild pain or moderate pain.   albuterol 108 (90 Base) MCG/ACT inhaler Commonly known as: VENTOLIN HFA Inhale 2 puffs into the lungs every 6 (six) hours as needed for wheezing or shortness of breath.   amLODipine 10 MG tablet Commonly known as: NORVASC Take 10 mg by mouth every morning.    amoxicillin-clavulanate 875-125 MG tablet Commonly known as: AUGMENTIN Take 1 tablet by mouth 2 (two) times daily.   aspirin EC 81 MG tablet Take 1 tablet by mouth daily.   diclofenac sodium 1 % Gel Commonly known as: VOLTAREN Apply 4 g topically 4 (four) times daily as needed.   ferrous sulfate 325 (65 FE) MG tablet Take 325 mg by mouth daily with breakfast.   furosemide 20 MG tablet Commonly known as: LASIX Take 20 mg by mouth daily.   gabapentin 300 MG capsule Commonly known as: NEURONTIN Take 300 mg by mouth 2 (two) times daily.   meloxicam 15 MG tablet Commonly known as: MOBIC Take 15 mg by mouth daily as needed for pain.   metoprolol 200 MG 24 hr tablet Commonly known as: TOPROL-XL Take 1 tablet by mouth daily.   olmesartan 40 MG tablet Commonly known as: BENICAR Take 1 tablet by mouth daily.   predniSONE 20 MG tablet Commonly known as: DELTASONE Take 2 tablets (40 mg total) by mouth daily with breakfast for 4 days. Start taking on: January 20, 2023        Follow-up Information     Verlon Au, MD Follow up in 1 week(s).   Specialty: Family Medicine Contact information: [address] [place] [address] [phone]                No Known Allergies  Consultations: None none   Procedures/Studies: DG Chest Port 1 View  Result Date: 01/18/2023 CLINICAL DATA:  sob EXAM: PORTABLE CHEST 1 VIEW COMPARISON:  Chest x-ray 11/13/2018, CT angio chest 11/13/2018 FINDINGS: Limited evaluation due to overlapping osseous structures and overlying soft tissues. Enlarged cardiac silhouette. The heart and mediastinal contours are unchanged. Prominence of the hilar vasculature. No focal consolidation. No pulmonary edema. Bilateral costophrenic angles are collimated off view. No pleural effusion. No pneumothorax. No acute osseous abnormality. IMPRESSION: Cardiomegaly with central venous congestion. Electronically Signed   By: Tish Frederickson M.D.   On: 01/18/2023 21:31     Discharge Exam: Vitals:   01/19/23 0840 01/19/23 0921  BP:  135/66  Pulse:  64  Resp:  20  Temp:  98.1 F (36.7 C)  SpO2: 99% 99%   Vitals:   01/19/23 0437 01/19/23 0506 01/19/23 0840 01/19/23 0921  BP: (!) 152/68   135/66  Pulse: 69   64  Resp: (!) 22   20  Temp: 98 F (36.7 C)   98.1 F (36.7 C)  TempSrc: Oral   Oral  SpO2: 100%  99% 99%  Weight:  (!) 159.9 kg    Height:  5' 1.5" (1.562 m)      General: Pt is alert, awake, not in acute distress, morbidly obese Cardiovascular: RRR, S1/S2 +, no rubs, no gallops Respiratory: Mild end expiratory wheezes at times, no rhonchi Abdominal: Soft, NT, ND, bowel  sounds + Extremities: Chronic lymphedema    The results of significant diagnostics from this hospitalization (including imaging, microbiology, ancillary and laboratory) are listed below for reference.     Microbiology: Recent Results (from the past 240 hour(s))  Resp panel by RT-PCR (RSV, Flu A&B, Covid) Anterior Nasal Swab     Status: None   Collection Time: 01/18/23  8:39 PM   Specimen: Anterior Nasal Swab  Result Value Ref Range Status   SARS Coronavirus 2 by RT PCR NEGATIVE NEGATIVE Final    Comment: (NOTE) SARS-CoV-2 target nucleic acids are NOT DETECTED.  The SARS-CoV-2 RNA is generally detectable in upper respiratory specimens during the acute phase of infection. The lowest concentration of SARS-CoV-2 viral copies this assay can detect is 138 copies/mL. A negative result does not preclude SARS-Cov-2 infection and should not be used as the sole basis for treatment or other patient management decisions. A negative result may occur with  improper specimen collection/handling, submission of specimen other than nasopharyngeal swab, presence of viral mutation(s) within the areas targeted by this assay, and inadequate number of viral copies(<138 copies/mL). A negative result must be combined with clinical observations,  patient history, and epidemiological information. The expected result is Negative.  Fact Sheet for Patients:  BloggerCourse.com  Fact Sheet for Healthcare Providers:  SeriousBroker.it  This test is no t yet approved or cleared by the [place] FDA and  has been authorized for detection and/or diagnosis of SARS-CoV-2 by FDA under an Emergency Use Authorization (EUA). This EUA will remain  in effect (meaning this test can be used) for the duration of the COVID-19 declaration under Section 564(b)(1) of the Act, 21 U.S.C.section 360bbb-3(b)(1), unless the authorization is terminated  or revoked sooner.       Influenza A by PCR NEGATIVE NEGATIVE Final   Influenza B by PCR NEGATIVE NEGATIVE Final    Comment: (NOTE) The Xpert Xpress SARS-CoV-2/FLU/RSV plus assay is intended as an aid in the diagnosis of influenza from Nasopharyngeal swab specimens and should not be used as a sole basis for treatment. Nasal washings and aspirates are unacceptable for Xpert Xpress SARS-CoV-2/FLU/RSV testing.  Fact Sheet for Patients: BloggerCourse.com  Fact Sheet for Healthcare Providers: SeriousBroker.it  This test is not yet approved or cleared by the [place] FDA and has been authorized for detection and/or diagnosis of SARS-CoV-2 by FDA under an Emergency Use Authorization (EUA). This EUA will remain in effect (meaning this test can be used) for the duration of the COVID-19 declaration under Section 564(b)(1) of the Act, 21 U.S.C. section 360bbb-3(b)(1), unless the authorization is terminated or revoked.     Resp Syncytial Virus by PCR NEGATIVE NEGATIVE Final    Comment: (NOTE) Fact Sheet for Patients: BloggerCourse.com  Fact Sheet for Healthcare Providers: SeriousBroker.it  This test is not yet approved or cleared by the [place] FDA and has been authorized for detection and/or diagnosis of SARS-CoV-2 by FDA under an Emergency Use Authorization (EUA). This EUA will remain in effect (meaning this test can be used) for the duration of the COVID-19 declaration under Section 564(b)(1) of the Act, 21 U.S.C. section 360bbb-3(b)(1), unless the authorization is terminated or revoked.  Performed at [facility], [address] [place], [place] [address]   Respiratory (~20 pathogens) panel by PCR     Status: Abnormal   Collection Time: 01/19/23  2:05 AM   Specimen: Nasopharyngeal Swab; Respiratory  Result Value Ref Range Status   Adenovirus NOT DETECTED NOT DETECTED  Final   Coronavirus 229E NOT DETECTED NOT DETECTED Final    Comment: (NOTE) The Coronavirus on the Respiratory Panel, DOES NOT test for the novel  Coronavirus (2019 nCoV)    Coronavirus HKU1 NOT DETECTED NOT DETECTED Final   Coronavirus NL63 NOT DETECTED NOT DETECTED Final   Coronavirus OC43 NOT DETECTED NOT DETECTED Final   Metapneumovirus NOT DETECTED NOT DETECTED Final   Rhinovirus / Enterovirus DETECTED (A) NOT DETECTED Final   Influenza A NOT DETECTED NOT DETECTED Final   Influenza B NOT DETECTED NOT DETECTED Final   Parainfluenza Virus 1 NOT DETECTED NOT DETECTED Final   Parainfluenza Virus 2 NOT DETECTED NOT DETECTED Final   Parainfluenza Virus 3 NOT DETECTED NOT DETECTED Final   Parainfluenza Virus 4 NOT DETECTED NOT DETECTED Final   Respiratory Syncytial Virus NOT DETECTED NOT DETECTED Final   Bordetella pertussis NOT DETECTED NOT DETECTED Final   Bordetella Parapertussis NOT DETECTED NOT DETECTED Final   Chlamydophila pneumoniae NOT DETECTED NOT DETECTED Final   Mycoplasma pneumoniae NOT DETECTED NOT DETECTED Final    Comment: Performed at [facility], [address] [place], [place] [address]     Labs: BNP (last 3 results) Recent Labs    01/18/23 2021  BNP 122.4*   Basic Metabolic Panel: Recent  Labs  Lab 01/18/23 2021 01/19/23 0749  NA 144 144  K 3.6 4.1  CL 107 107  CO2 30 29  GLUCOSE 130* 191*  BUN 13 11  CREATININE 0.98 0.78  CALCIUM 8.8* 8.5*   Liver Function Tests: Recent Labs  Lab 01/18/23 2021  AST 21  ALT 18  ALKPHOS 64  BILITOT 0.7  PROT 7.1  ALBUMIN 3.8   No results for input(s): "LIPASE", "AMYLASE" in the last 168 hours. No results for input(s): "AMMONIA" in the last 168 hours. CBC: Recent Labs  Lab 01/18/23 2021 01/19/23 0749  WBC 7.9 6.7  NEUTROABS 6.3  --   HGB 10.7* 10.1*  HCT 35.6* 34.1*  MCV 81.7 82.0  PLT 140* 198   Cardiac Enzymes: No results for input(s): "CKTOTAL", "CKMB", "CKMBINDEX", "TROPONINI" in the last 168 hours. BNP: Invalid input(s): "POCBNP" CBG: Recent Labs  Lab 01/19/23 0803  GLUCAP 174*   D-Dimer No results for input(s): "DDIMER" in the last 72 hours. Hgb A1c No results for input(s): "HGBA1C" in the last 72 hours. Lipid Profile No results for input(s): "CHOL", "HDL", "LDLCALC", "TRIG", "CHOLHDL", "LDLDIRECT" in the last 72 hours. Thyroid function studies No results for input(s): "TSH", "T4TOTAL", "T3FREE", "THYROIDAB" in the last 72 hours.  Invalid input(s): "FREET3" Anemia work up No results for input(s): "VITAMINB12", "FOLATE", "FERRITIN", "TIBC", "IRON", "RETICCTPCT" in the last 72 hours. Urinalysis No results found for: "COLORURINE", "APPEARANCEUR", "LABSPEC", "PHURINE", "GLUCOSEU", "HGBUR", "BILIRUBINUR", "KETONESUR", "PROTEINUR", "UROBILINOGEN", "NITRITE", "LEUKOCYTESUR" Sepsis Labs Recent Labs  Lab 01/18/23 2021 01/19/23 0749  WBC 7.9 6.7   Microbiology Recent Results (from the past 240 hour(s))  Resp panel by RT-PCR (RSV, Flu A&B, Covid) Anterior Nasal Swab     Status: None   Collection Time: 01/18/23  8:39 PM   Specimen: Anterior Nasal Swab  Result Value Ref Range Status   SARS Coronavirus 2 by RT PCR NEGATIVE NEGATIVE Final    Comment: (NOTE) SARS-CoV-2 target nucleic acids are NOT  DETECTED.  The SARS-CoV-2 RNA is generally detectable in upper respiratory specimens during the acute phase of infection. The lowest concentration of SARS-CoV-2 viral copies this assay can detect is 138 copies/mL. A negative result does not  preclude SARS-Cov-2 infection and should not be used as the sole basis for treatment or other patient management decisions. A negative result may occur with  improper specimen collection/handling, submission of specimen other than nasopharyngeal swab, presence of viral mutation(s) within the areas targeted by this assay, and inadequate number of viral copies(<138 copies/mL). A negative result must be combined with clinical observations, patient history, and epidemiological information. The expected result is Negative.  Fact Sheet for Patients:  BloggerCourse.com  Fact Sheet for Healthcare Providers:  SeriousBroker.it  This test is no t yet approved or cleared by the [place] FDA and  has been authorized for detection and/or diagnosis of SARS-CoV-2 by FDA under an Emergency Use Authorization (EUA). This EUA will remain  in effect (meaning this test can be used) for the duration of the COVID-19 declaration under Section 564(b)(1) of the Act, 21 U.S.C.section 360bbb-3(b)(1), unless the authorization is terminated  or revoked sooner.       Influenza A by PCR NEGATIVE NEGATIVE Final   Influenza B by PCR NEGATIVE NEGATIVE Final    Comment: (NOTE) The Xpert Xpress SARS-CoV-2/FLU/RSV plus assay is intended as an aid in the diagnosis of influenza from Nasopharyngeal swab specimens and should not be used as a sole basis for treatment. Nasal washings and aspirates are unacceptable for Xpert Xpress SARS-CoV-2/FLU/RSV testing.  Fact Sheet for Patients: BloggerCourse.com  Fact Sheet for Healthcare Providers: SeriousBroker.it  This test is not yet  approved or cleared by the [place] FDA and has been authorized for detection and/or diagnosis of SARS-CoV-2 by FDA under an Emergency Use Authorization (EUA). This EUA will remain in effect (meaning this test can be used) for the duration of the COVID-19 declaration under Section 564(b)(1) of the Act, 21 U.S.C. section 360bbb-3(b)(1), unless the authorization is terminated or revoked.     Resp Syncytial Virus by PCR NEGATIVE NEGATIVE Final    Comment: (NOTE) Fact Sheet for Patients: BloggerCourse.com  Fact Sheet for Healthcare Providers: SeriousBroker.it  This test is not yet approved or cleared by the [place] FDA and has been authorized for detection and/or diagnosis of SARS-CoV-2 by FDA under an Emergency Use Authorization (EUA). This EUA will remain in effect (meaning this test can be used) for the duration of the COVID-19 declaration under Section 564(b)(1) of the Act, 21 U.S.C. section 360bbb-3(b)(1), unless the authorization is terminated or revoked.  Performed at [facility], [address] [place], [place] [address]   Respiratory (~20 pathogens) panel by PCR     Status: Abnormal   Collection Time: 01/19/23  2:05 AM   Specimen: Nasopharyngeal Swab; Respiratory  Result Value Ref Range Status   Adenovirus NOT DETECTED NOT DETECTED Final   Coronavirus 229E NOT DETECTED NOT DETECTED Final    Comment: (NOTE) The Coronavirus on the Respiratory Panel, DOES NOT test for the novel  Coronavirus (2019 nCoV)    Coronavirus HKU1 NOT DETECTED NOT DETECTED Final   Coronavirus NL63 NOT DETECTED NOT DETECTED Final   Coronavirus OC43 NOT DETECTED NOT DETECTED Final   Metapneumovirus NOT DETECTED NOT DETECTED Final   Rhinovirus / Enterovirus DETECTED (A) NOT DETECTED Final   Influenza A NOT DETECTED NOT DETECTED Final   Influenza B NOT DETECTED NOT DETECTED Final   Parainfluenza Virus 1 NOT DETECTED NOT  DETECTED Final   Parainfluenza Virus 2 NOT DETECTED NOT DETECTED Final   Parainfluenza Virus 3 NOT DETECTED NOT DETECTED Final   Parainfluenza Virus 4 NOT DETECTED NOT DETECTED Final  Respiratory Syncytial Virus NOT DETECTED NOT DETECTED Final   Bordetella pertussis NOT DETECTED NOT DETECTED Final   Bordetella Parapertussis NOT DETECTED NOT DETECTED Final   Chlamydophila pneumoniae NOT DETECTED NOT DETECTED Final   Mycoplasma pneumoniae NOT DETECTED NOT DETECTED Final    Comment: Performed at [facility], [address], [place], [place] [address]    FURTHER DISCHARGE INSTRUCTIONS:   Get Medicines reviewed and adjusted: Please take all your medications with you for your next visit with your Primary MD   Laboratory/radiological data: Please request your Primary MD to go over all hospital tests and procedure/radiological results at the follow up, please ask your Primary MD to get all Hospital records sent to his/her office.   In some cases, they will be blood work, cultures and biopsy results pending at the time of your discharge. Please request that your primary care M.D. goes through all the records of your hospital data and follows up on these results.   Also Note the following: If you experience worsening of your admission symptoms, develop shortness of breath, life threatening emergency, suicidal or homicidal thoughts you must seek medical attention immediately by calling 911 or calling your MD immediately  if symptoms less severe.   You must read complete instructions/literature along with all the possible adverse reactions/side effects for all the Medicines you take and that have been prescribed to you. Take any new Medicines after you have completely understood and accpet all the possible adverse reactions/side effects.    Do not drive when taking Pain medications or sleeping medications (Benzodaizepines)   Do not take more than prescribed Pain, Sleep and Anxiety  Medications. It is not advisable to combine anxiety,sleep and pain medications without talking with your primary care practitioner   Special Instructions: If you have smoked or chewed Tobacco  in the last 2 yrs please stop smoking, stop any regular Alcohol  and or any Recreational drug use.   Wear Seat belts while driving.   Please note: You were cared for by a hospitalist during your hospital stay. Once you are discharged, your primary care physician will handle any further medical issues. Please note that NO REFILLS for any discharge medications will be authorized once you are discharged, as it is imperative that you return to your primary care physician (or establish a relationship with a primary care physician if you do not have one) for your post hospital discharge needs so that they can reassess your need for medications and monitor your lab values  Time coordinating discharge: Over 30 minutes  SIGNED:   Hughie Closs, MD  [facility] 01/19/2023, 10:30 AM *Please note that this is a verbal dictation therefore any spelling or grammatical errors are due to the "Dragon Medical One" system interpretation. If 7PM-7AM, please contact night-coverage [email or web address]

## 2023-01-19 NOTE — Progress Notes (Signed)
The patient stated that she has been unable to get coverage from her insurance to get treatment for medical help with her open areas and yeast in skin folds and also home oxygen.

## 2023-01-19 NOTE — Evaluation (Addendum)
Physical Therapy One Time Evaluation and Discharge from Acute PT Patient Details Name: Renee Durham MRN: [id number] DOB: 1955-03-14 Today's Date: 01/19/2023  History of Present Illness  67 y.o. female came into the ED with 1 week symptoms of cough and shortness of breath and admitted 01/18/23 for Acute respiratory failure with hypoxia.  Past medical history significant of Morbid obesity, HTN, chronic hypoxic resp failure on 3L O2 at night to sleep  Clinical Impression  Patient evaluated by Physical Therapy with no further acute PT needs identified. All education has been completed and the patient has no further questions.  Pt reluctant to mobilize and required some encouragement for OOB.  Pt eventually agreeable to transfer to recliner to eat breakfast.  Pt states she is ambulatory within her home and has no concerns with mobilizing at this time.  Recommend staff continue to encourage pt's mobility during hospitalization however no further acute PT needs identified at this time.  PT is signing off. Thank you for this referral.         If plan is discharge home, recommend the following:     Can travel by private vehicle        Equipment Recommendations None recommended by PT  Recommendations for Other Services       Functional Status Assessment Patient has had a recent decline in their functional status and demonstrates the ability to make significant improvements in function in a reasonable and predictable amount of time.     Precautions / Restrictions Precautions Precautions: Fall Precaution Comments: wears 3L O2 at night Restrictions Weight Bearing Restrictions: No Other Position/Activity Restrictions: Chronic O2      Mobility  Bed Mobility Overal bed mobility: Modified Independent             General bed mobility comments: increased time and effort (also required encouragement for OOB); SpO2 100% on 4L O2 [place] at rest    Transfers Overall transfer level: Needs  assistance Equipment used: Rolling walker (2 wheels) Transfers: Sit to/from Stand, Bed to chair/wheelchair/BSC Sit to Stand: Contact guard assist   Step pivot transfers: Contact guard assist       General transfer comment: provided CGA for safety however pt did not require physical assist, pt mostly c/o cords/lines being her way so assisted with management of lines; remained on 4L O2 [place] and SPO2 94% after transfer    Ambulation/Gait               General Gait Details: pt declined  Stairs            Wheelchair Mobility     Tilt Bed    Modified Rankin (Stroke Patients Only)       Balance           Standing balance support: Reliant on assistive device for balance, During functional activity, Bilateral upper extremity supported Standing balance-Leahy Scale: Poor                               Pertinent Vitals/Pain Pain Assessment Pain Assessment: No/denies pain    Home Living Family/patient expects to be discharged to:: Private residence Living Arrangements: Children Available Help at Discharge: Family Type of Home: Apartment Home Access: Level entry       Home Layout: One level Home Equipment: Agricultural consultant (2 wheels);Rollator (4 wheels);Shower seat      Prior Function Prior Level of Function : Independent/Modified Independent  Mobility Comments: reports she is ambulatory around household, needs assist for getting into bed due to height ADLs Comments: Continues to perform her own ADL and assists with iADL, but son completes most of household iADL.  No longer drives     Extremity/Trunk Assessment   Upper Extremity Assessment Upper Extremity Assessment: Overall WFL for tasks assessed    Lower Extremity Assessment Lower Extremity Assessment: Generalized weakness    Cervical / Trunk Assessment Cervical / Trunk Assessment: Other exceptions Cervical / Trunk Exceptions: body habitus  Communication    Communication Communication: No apparent difficulties  Cognition Arousal: Alert Behavior During Therapy: WFL for tasks assessed/performed Overall Cognitive Status: Within Functional Limits for tasks assessed                                          General Comments      Exercises     Assessment/Plan    PT Assessment Patient does not need any further PT services  PT Problem List         PT Treatment Interventions      PT Goals (Current goals can be found in the Care Plan section)  Acute Rehab PT Goals PT Goal Formulation: All assessment and education complete, DC therapy    Frequency       Co-evaluation               AM-PAC PT "6 Clicks" Mobility  Outcome Measure Help needed turning from your back to your side while in a flat bed without using bedrails?: A Little Help needed moving from lying on your back to sitting on the side of a flat bed without using bedrails?: A Little Help needed moving to and from a bed to a chair (including a wheelchair)?: A Little Help needed standing up from a chair using your arms (e.g., wheelchair or bedside chair)?: A Little Help needed to walk in hospital room?: A Little Help needed climbing 3-5 steps with a railing? : A Lot 6 Click Score: 17    End of Session Equipment Utilized During Treatment: Oxygen Activity Tolerance: Patient tolerated treatment well Patient left: in chair;with call bell/phone within reach;with chair alarm set Nurse Communication: Mobility status PT Visit Diagnosis: Difficulty in walking, not elsewhere classified (R26.2)    Time: 5621-3086 PT Time Calculation (min) (ACUTE ONLY): 14 min   Charges:   PT Evaluation $PT Eval Low Complexity: 1 Low   PT General Charges $$ ACUTE PT VISIT: 1 Visit       Thomasene Mohair PT, DPT Physical Therapist Acute Rehabilitation Services Office: [phone]  Renee Durham 01/19/2023, 12:07 PM

## 2023-03-08 ENCOUNTER — Ambulatory Visit (INDEPENDENT_AMBULATORY_CARE_PROVIDER_SITE_OTHER): Payer: Medicare Other | Admitting: Podiatry

## 2023-03-08 DIAGNOSIS — Z91198 Patient's noncompliance with other medical treatment and regimen for other reason: Secondary | ICD-10-CM

## 2023-03-08 NOTE — Progress Notes (Signed)
1. Failure to attend appointment with reason given    Canceled appt.

## 2023-03-21 ENCOUNTER — Encounter (HOSPITAL_COMMUNITY): Payer: Self-pay

## 2023-03-21 ENCOUNTER — Emergency Department (HOSPITAL_COMMUNITY)
Admission: EM | Admit: 2023-03-21 | Discharge: 2023-03-21 | Disposition: A | Discharge status: Home / Self Care | Payer: No Typology Code available for payment source | Attending: Emergency Medicine | Admitting: Emergency Medicine

## 2023-03-21 ENCOUNTER — Other Ambulatory Visit: Payer: Self-pay

## 2023-03-21 ENCOUNTER — Emergency Department (HOSPITAL_COMMUNITY): Payer: No Typology Code available for payment source

## 2023-03-21 DIAGNOSIS — M79672 Pain in left foot: Secondary | ICD-10-CM | POA: Diagnosis not present

## 2023-03-21 DIAGNOSIS — M79671 Pain in right foot: Secondary | ICD-10-CM | POA: Insufficient documentation

## 2023-03-21 DIAGNOSIS — Z7982 Long term (current) use of aspirin: Secondary | ICD-10-CM | POA: Insufficient documentation

## 2023-03-21 DIAGNOSIS — G629 Polyneuropathy, unspecified: Secondary | ICD-10-CM

## 2023-03-21 LAB — BASIC METABOLIC PANEL
Anion gap: 11 (ref 5–15)
BUN: 10 mg/dL (ref 8–23)
CO2: 25 mmol/L (ref 22–32)
Calcium: 9.2 mg/dL (ref 8.9–10.3)
Chloride: 105 mmol/L (ref 98–111)
Creatinine, Ser: 1.01 mg/dL — ABNORMAL HIGH (ref 0.44–1.00)
GFR, Estimated: >60 mL/min (ref >60)
Glucose, Bld: 97 mg/dL (ref 70–99)
Potassium: 3.7 mmol/L (ref 3.5–5.1)
Sodium: 141 mmol/L (ref 135–145)

## 2023-03-21 LAB — CBC WITH DIFFERENTIAL/PLATELET
Abs Immature Granulocytes: 0.04 10*3/uL (ref 0.00–0.07)
Basophils Absolute: 0 10*3/uL (ref 0.0–0.1)
Basophils Relative: 1 %
Eosinophils Absolute: 0.4 10*3/uL (ref 0.0–0.5)
Eosinophils Relative: 5 %
HCT: 32.1 % — ABNORMAL LOW (ref 36.0–46.0)
Hemoglobin: 9.3 g/dL — ABNORMAL LOW (ref 12.0–15.0)
Immature Granulocytes: 1 %
Lymphocytes Relative: 13 %
Lymphs Abs: 1 10*3/uL (ref 0.7–4.0)
MCH: 22.9 pg — ABNORMAL LOW (ref 26.0–34.0)
MCHC: 29 g/dL — ABNORMAL LOW (ref 30.0–36.0)
MCV: 79.1 fL — ABNORMAL LOW (ref 80.0–100.0)
Monocytes Absolute: 0.9 10*3/uL (ref 0.1–1.0)
Monocytes Relative: 11 %
Neutro Abs: 5.6 10*3/uL (ref 1.7–7.7)
Neutrophils Relative %: 69 %
Platelets: 269 10*3/uL (ref 150–400)
RBC: 4.06 MIL/uL (ref 3.87–5.11)
RDW: 18.5 % — ABNORMAL HIGH (ref 11.5–15.5)
WBC: 7.9 10*3/uL (ref 4.0–10.5)
nRBC: 0 % (ref 0.0–0.2)

## 2023-03-21 LAB — SEDIMENTATION RATE: Sed Rate: 44 mm/h — ABNORMAL HIGH (ref 0–22)

## 2023-03-21 LAB — C-REACTIVE PROTEIN: CRP: 6.3 mg/dL — ABNORMAL HIGH (ref <1.0)

## 2023-03-21 MED ORDER — OXYCODONE-ACETAMINOPHEN 5-325 MG PO TABS: 1.0000 | ORAL_TABLET | Freq: Four times a day (QID) | ORAL | 0 refills | Status: AC | PRN

## 2023-03-21 MED ORDER — OXYCODONE-ACETAMINOPHEN 5-325 MG PO TABS
1.0000 | ORAL_TABLET | Freq: Once | ORAL | Status: AC
  Administered 2023-03-21: 1 via ORAL
  Filled 2023-03-21: qty 1

## 2023-03-21 NOTE — ED Provider Notes (Signed)
 [place] EMERGENCY DEPARTMENT AT [facility] Provider Note   CSN: [id number] Arrival date & time: 03/21/23  1423     History  Chief Complaint  Patient presents with   Wound Check    Renee Durham is a 69 y.o. female.  68 year old female with history of morbid obesity presents with increasing pain to her bilateral feet.  States she has neuropathy and is on gabapentin  300 mg twice a day.  Denies any fever or chills.  No red streaks going up her legs.  Her daughter who is at bedside wrapped her legs and states that the wounds on her legs which are chronic have improved.  Patient uses a cane to ambulate at this time.  Has home health at this time       Home Medications Prior to Admission medications   Medication Sig Start Date End Date Taking? Authorizing Provider  acetaminophen  (TYLENOL ) 500 MG tablet Take 500-1,000 mg by mouth every 6 (six) hours as needed for mild pain or moderate pain.    [provider]  albuterol  (PROVENTIL  HFA;VENTOLIN  HFA) 108 (90 Base) MCG/ACT inhaler Inhale 2 puffs into the lungs every 6 (six) hours as needed for wheezing or shortness of breath.  11/07/17   [provider]  amLODipine  (NORVASC ) 10 MG tablet Take 10 mg by mouth every morning.  09/30/13   [provider]  aspirin  81 MG EC tablet Take 1 tablet by mouth daily. 05/09/16   [provider]  diclofenac  sodium (VOLTAREN ) 1 % GEL Apply 4 g topically 4 (four) times daily as needed. 12/04/17   Hilts, Ozell, MD  ferrous sulfate  325 (65 FE) MG tablet Take 325 mg by mouth daily with breakfast.    [provider]  furosemide  (LASIX ) 20 MG tablet Take 20 mg by mouth daily.  01/26/17   [provider]  gabapentin  (NEURONTIN ) 300 MG capsule Take 300 mg by mouth 2 (two) times daily. 08/01/22   [provider]  meloxicam (MOBIC) 15 MG tablet Take 15 mg by mouth daily as needed for pain.    [provider]  metoprolol  (TOPROL -XL)  200 MG 24 hr tablet Take 1 tablet by mouth daily. 03/17/20   [provider]  olmesartan (BENICAR) 40 MG tablet Take 1 tablet by mouth daily. 03/30/20   [provider]      Allergies    Patient has no known allergies.    Review of Systems   Review of Systems  All other systems reviewed and are negative.   Physical Exam Updated Vital Signs BP (!) 171/103   Pulse 82   Temp 98.7 F (37.1 C) (Oral)   Resp 19   Ht 1.549 m (5' 1)   Wt (!) 159.9 kg   SpO2 99%   BMI 66.61 kg/m  Physical Exam Vitals and nursing note reviewed.  Constitutional:      General: She is not in acute distress.    Appearance: Normal appearance. She is well-developed. She is not toxic-appearing.  HENT:     Head: Normocephalic and atraumatic.  Eyes:     General: Lids are normal.     Conjunctiva/sclera: Conjunctivae normal.     Pupils: Pupils are equal, round, and reactive to light.  Neck:     Thyroid: No thyroid mass.     Trachea: No tracheal deviation.  Cardiovascular:     Rate and Rhythm: Normal rate and regular rhythm.     Heart sounds: Normal heart sounds.  No murmur heard.    No gallop.  Pulmonary:     Effort: Pulmonary effort is normal. No respiratory distress.     Breath sounds: Normal breath sounds. No stridor. No decreased breath sounds, wheezing, rhonchi or rales.  Abdominal:     General: There is no distension.     Palpations: Abdomen is soft.     Tenderness: There is no abdominal tenderness. There is no rebound.  Musculoskeletal:        General: No tenderness. Normal range of motion.     Cervical back: Normal range of motion and neck supple.     Comments: Severe lymphedema noted to bilateral lower extremities.  No evidence of erythema noted.  Neurovasc intact at both feet  Skin:    General: Skin is warm and dry.     Findings: No abrasion or rash.  Neurological:     Mental Status: She is alert and oriented to person, place, and time. Mental status is at baseline.      GCS: GCS eye subscore is 4. GCS verbal subscore is 5. GCS motor subscore is 6.     Cranial Nerves: No cranial nerve deficit.     Sensory: No sensory deficit.     Motor: Motor function is intact.  Psychiatric:        Attention and Perception: Attention normal.        Speech: Speech normal.        Behavior: Behavior normal.     ED Results / Procedures / Treatments   Labs (all labs ordered are listed, but only abnormal results are displayed) Labs Reviewed  BASIC METABOLIC PANEL - Abnormal; Notable for the following components:      Result Value   Creatinine, Ser 1.01 (*)    All other components within normal limits  SEDIMENTATION RATE - Abnormal; Notable for the following components:   Sed Rate 44 (*)    All other components within normal limits  CBC WITH DIFFERENTIAL/PLATELET - Abnormal; Notable for the following components:   Hemoglobin 9.3 (*)    HCT 32.1 (*)    MCV 79.1 (*)    MCH 22.9 (*)    MCHC 29.0 (*)    RDW 18.5 (*)    All other components within normal limits  CBC WITH DIFFERENTIAL/PLATELET  C-REACTIVE PROTEIN    EKG None ED ECG REPORT   Date: 03/21/2023  Rate: 72  Rhythm: normal sinus rhythm  QRS Axis: normal  Intervals: normal  ST/T Wave abnormalities: normal  Conduction Disutrbances:none  Narrative Interpretation:   Old EKG Reviewed: none available  I have personally reviewed the EKG tracing and agree with the computerized printout as noted.  Radiology DG Foot Complete Right Result Date: 03/21/2023 CLINICAL DATA:  Bilateral foot pain and swelling for 4 weeks. History of lymphedema. EXAM: RIGHT FOOT COMPLETE - 3+ VIEW COMPARISON:  None Available. FINDINGS: Large body habitus. Large plantar calcaneal heel spur. Mild chronic enthesopathic change at the Achilles insertion on the calcaneus. Mild pes planus. Mild-to-moderate talonavicular joint space narrowing. No acute fracture dislocation. IMPRESSION: 1. Large plantar calcaneal heel spur. 2. Mild pes planus.  3. 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 Electronically Signed   By: Tanda Lyons M.D.   On: 03/21/2023 17:08   DG Foot Complete Left Result Date: 03/21/2023 CLINICAL DATA:  Bilateral foot pain and swelling for 4 weeks. History of lymphedema. EXAM: LEFT FOOT - COMPLETE 3+ VIEW COMPARISON:  Left tibia and fibula radiographs 11/14/2017 FINDINGS: Large body habitus. Moderate-to-large plantar calcaneal  heel spur. Mild chronic enthesopathic change at the Achilles insertion on the calcaneus. Mild pes planus. Moderate dorsal tarsometatarsal degenerative osteophytosis. Mild-to-moderate talonavicular and navicular-medial cuneiform joint space narrowing and subchondral sclerosis. No acute fracture or dislocation. IMPRESSION: 1. Moderate-to-large plantar calcaneal heel spur. 2. Mild pes planus. 3. 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 Electronically Signed   By: Tanda Lyons M.D.   On: 03/21/2023 17:06    Procedures Procedures    Medications Ordered in ED Medications  oxyCODONE -acetaminophen  (PERCOCET/ROXICET) 5-325 MG per tablet 1 tablet (has no administration in time range)    ED Course/ Medical Decision Making/ A&P                                 Medical Decision Making Amount and/or Complexity of Data Reviewed ECG/medicine tests: ordered.  Risk Prescription drug management.   Bilateral foot x-rays are negative at this time for fracture.  Patient treated for pain here.  Will place on pain medications.  Patient is EKG shows normal sinus rhythm at 72.  No evidence of infection at this time.  Suspect that patient has neuropathy and she is already on gabapentin .  Encouraged her to follow-up with her doctor.        Final Clinical Impression(s) / ED Diagnoses Final diagnoses:  None    Rx / DC Orders ED Discharge Orders     None         Dasie Faden, MD 03/21/23 2004

## 2023-03-21 NOTE — ED Notes (Signed)
Pt. Right leg dressed with gauze and coband

## 2023-03-21 NOTE — ED Triage Notes (Signed)
Patient BIB EMS for bed sore on back of right leg, painful and red. Patient also reports legs have been weeping for 2 weeks. Patient intermittently wears oxygen at home.

## 2023-03-21 NOTE — ED Provider Triage Note (Signed)
 Emergency Medicine Provider Triage Evaluation Note  Renee Durham , a 68 y.o. female  was evaluated in triage.  Pt complains of bilateral feet pain for the last 4 weeks.  Worse at night.  History of lymphedema and has home health coming and changing bandages regularly.  Last seen by home health yesterday where her right leg was rewrapped.  Has been able to ambulate today.  States she has had more numbness and tingling in her feet as of the last 3 to 4 weeks.  Denies fever, headache, cough, congestion, chest pain, worsening shortness of breath, Review of Systems  Positive: N/a Negative: N/a  Physical Exam  BP (!) 163/77 (BP Location: Left Arm)   Pulse 76   Temp 98.7 F (37.1 C) (Oral)   Resp 19   Ht 5' 1 (1.549 m)   Wt (!) 159.9 kg   SpO2 97%   BMI 66.61 kg/m  Gen:   Awake, no distress   Resp:  Normal effort  MSK:   Moves extremities without difficulty  Other:  Notable swelling present in both feet bilaterally.  DP pulses are notable to be 1+ bilaterally.  No wounds noted to bottom of feet.  Medical Decision Making  Medically screening exam initiated at 3:46 PM.  Appropriate orders placed.  Zaina BISMA KLETT was informed that the remainder of the evaluation will be completed by another provider, this initial triage assessment does not replace that evaluation, and the importance of remaining in the ED until their evaluation is complete.     Beola Terrall RAMAN, [place] 03/21/23 [phone]

## 2023-04-19 ENCOUNTER — Ambulatory Visit: Payer: No Typology Code available for payment source | Admitting: Podiatry

## 2023-06-14 ENCOUNTER — Ambulatory Visit (INDEPENDENT_AMBULATORY_CARE_PROVIDER_SITE_OTHER): Admitting: Podiatry

## 2023-06-14 DIAGNOSIS — M79676 Pain in unspecified toe(s): Secondary | ICD-10-CM | POA: Diagnosis not present

## 2023-06-14 DIAGNOSIS — R7303 Prediabetes: Secondary | ICD-10-CM

## 2023-06-14 DIAGNOSIS — B351 Tinea unguium: Secondary | ICD-10-CM | POA: Diagnosis not present

## 2023-06-14 NOTE — Progress Notes (Signed)
  Subjective:  Patient ID: Renee Durham, female    DOB: [birth year],   MRN: [id number]  No chief complaint on file.   68 y.o. female presents for concern of thickened elongated and painful nails that are difficult to trim. Requesting to have them trimmed today. Denies burning and tingling in their feet. Patient is pre-diabetic and last A1c was  Lab Results  Component Value Date   HGBA1C 6.0 (H) 11/13/2018   .   PCP:  Jacqulyne Maxim, MD    . Denies any other pedal complaints. Denies n/v/f/c.   Past Medical History:  Diagnosis Date   Anemia    Arthritis    knees/back   Chronic kidney disease    H/O sickle cell trait    Hypertension    Obesities, morbid (HCC)     Objective:  Physical Exam: Vascular: DP/PT pulses 2/4 bilateral. CFT <3 seconds. Absent hair growth on digits. Edema noted to bilateral lower extremities. Xerosis noted bilaterally.  Skin. No lacerations or abrasions bilateral feet. Nails 1-5 bilateral  are thickened discolored and elongated with subungual debris.  Musculoskeletal: MMT 5/5 bilateral lower extremities in DF, PF, Inversion and Eversion. Deceased ROM in DF of ankle joint.  Neurological: Sensation intact to light touch. Protective sensation diminished bilateral.    Assessment:   1. Pain due to onychomycosis of toenail   2. Prediabetes      Plan:  Patient was evaluated and treated and all questions answered. -Discussed and educated patient on  foot care, especially with  regards to the vascular, neurological and musculoskeletal systems.  -Stressed the importance of good glycemic control and the detriment of not  controlling glucose levels in relation to the foot. -Discussed supportive shoes at all times and checking feet regularly.  -Mechanically debrided all nails 1-5 bilateral using sterile nail nipper and filed with dremel without incident  -Answered all patient questions -Patient to return  in 3 months for at risk foot care -Patient  advised to call the office if any problems or questions arise in the meantime.   Jennefer Moats, DPM

## 2023-09-13 ENCOUNTER — Ambulatory Visit (INDEPENDENT_AMBULATORY_CARE_PROVIDER_SITE_OTHER): Admitting: Podiatry

## 2023-09-13 ENCOUNTER — Encounter: Payer: Self-pay | Admitting: Podiatry

## 2023-09-13 DIAGNOSIS — M79676 Pain in unspecified toe(s): Secondary | ICD-10-CM | POA: Diagnosis not present

## 2023-09-13 DIAGNOSIS — B351 Tinea unguium: Secondary | ICD-10-CM | POA: Diagnosis not present

## 2023-09-13 NOTE — Progress Notes (Signed)
  Subjective:  Patient ID: Renee Durham, female    DOB: January 10, 1956,   MRN: [id number]  No chief complaint on file.   68 y.o. female presents for concern of thickened elongated and painful nails that are difficult to trim. Requesting to have them trimmed today. Denies burning and tingling in their feet. Patient is pre-diabetic and last A1c was  Lab Results  Component Value Date   HGBA1C 6.0 (H) 11/13/2018   .   PCP:  Jolee Madelin Patch, MD    . Denies any other pedal complaints. Denies n/v/f/c.   Past Medical History:  Diagnosis Date   Anemia    Arthritis    knees/back   Chronic kidney disease    H/O sickle cell trait    Hypertension    Obesities, morbid (HCC)     Objective:  Physical Exam: Vascular: DP/PT pulses 2/4 bilateral. CFT <3 seconds. Absent hair growth on digits. Edema noted to bilateral lower extremities. Xerosis noted bilaterally.  Skin. No lacerations or abrasions bilateral feet. Nails 1-5 bilateral  are thickened discolored and elongated with subungual debris.  Musculoskeletal: MMT 5/5 bilateral lower extremities in DF, PF, Inversion and Eversion. Deceased ROM in DF of ankle joint.  Neurological: Sensation intact to light touch. Protective sensation diminished bilateral.    Assessment:   1. Pain due to onychomycosis of toenail      Plan:  Patient was evaluated and treated and all questions answered. -Discussed and educated patient on  foot care, especially with  regards to the vascular, neurological and musculoskeletal systems.  -Stressed the importance of good glycemic control and the detriment of not  controlling glucose levels in relation to the foot. -Discussed supportive shoes at all times and checking feet regularly.  -Mechanically debrided all nails 1-5 bilateral using sterile nail nipper and filed with dremel without incident  -Answered all patient questions -Patient to return  in 3 months for at risk foot care -Patient advised to call the  office if any problems or questions arise in the meantime.   Asberry Failing, DPM

## 2024-01-02 ENCOUNTER — Ambulatory Visit: Admitting: Podiatry

## 2024-01-02 ENCOUNTER — Encounter: Payer: Self-pay | Admitting: Podiatry

## 2024-01-02 DIAGNOSIS — R7303 Prediabetes: Secondary | ICD-10-CM | POA: Diagnosis not present

## 2024-01-02 DIAGNOSIS — M79676 Pain in unspecified toe(s): Secondary | ICD-10-CM | POA: Diagnosis not present

## 2024-01-02 DIAGNOSIS — B351 Tinea unguium: Secondary | ICD-10-CM | POA: Diagnosis not present

## 2024-01-10 ENCOUNTER — Encounter: Payer: Self-pay | Admitting: Podiatry

## 2024-01-10 NOTE — Progress Notes (Signed)
  Subjective:  Patient ID: Renee Durham, female    DOB: 1955-04-11,  MRN: [id number]  Renee Durham presents to clinic today for painful elongated mycotic toenails 1-5 bilaterally which are tender when wearing enclosed shoe gear. Pain is relieved with periodic professional debridement.  Chief Complaint  Patient presents with   Diabetes    A1c 5.7. Dr. Jolee is her PCP. Last visit was in June   New problem(s): None.   PCP is Jolee Madelin Patch, MD.  No Known Allergies  Review of Systems: Negative except as noted in the HPI.  Objective: No changes noted in today's physical examination. There were no vitals filed for this visit. Renee Durham is a pleasant 68 y.o. female morbidly obese in NAD. AAO x 3.  Vascular Examination: Capillary refill time <3 seconds b/l LE. Palpable pedal pulses b/l LE. Digital hair absent b/l. . Skin temperature gradient WNL b/l.SABRA Lymphedema present BLE.SABRA  Dermatological Examination: Pedal skin with normal turgor, texture and tone b/l. No open wounds. No interdigital macerations b/l. Toenails 1-5 b/l thickened, discolored, dystrophic with subungual debris. There is pain on palpation to dorsal aspect of nailplates.   Neurological Examination: Protective sensation intact with 10 gram monofilament b/l LE. Vibratory sensation intact b/l LE.   Musculoskeletal Examination: Muscle strength 5/5 to all lower extremity muscle groups bilaterally. Pes planus deformity noted bilateral LE.  Assessment/Plan: 1. Pain due to onychomycosis of toenail   2. Prediabetes    Consent given for treatment. Patient examined. All patient's and/or POA's questions/concerns addressed on today's visit. Mycotic toenails 1-5 b/l debrided in length and girth without incident. Continue soft, supportive shoe gear daily. Report any pedal injuries to medical professional. Call office if there are any quesitons/concerns. -Patient/POA to call should there be question/concern in the  interim.   Return in about 3 months (around 04/03/2024).  Delon LITTIE Merlin, DPM      [place] LOCATION: [address] [place] [address]                   Office [phone]   [facility] LOCATION: [address] [place], [place] [address] Office [phone]

## 2024-02-21 ENCOUNTER — Ambulatory Visit: Payer: Self-pay

## 2024-02-21 NOTE — Telephone Encounter (Signed)
 FYI Only or Action Required?: Action required by provider: request for appointment.  Patient is followed in Pulmonology for: new patient for COPD   Called Nurse Triage reporting Numbness.  Triage Disposition: See PCP When Office is Open (Within 3 Days)  Patient/caregiver understands and will follow disposition?: Yes    Copied from CRM #[id number]. Topic: Clinical - Red Word Triage >> Feb 21, 2024  3:14 PM Leila BROCKS wrote: Red Word that prompted transfer to Nurse Triage: Patient [phone] is returning the office call to scheduled a pulmonary appointment. NPT referred by FNP, Mattie for COPD (chronic obstructive pulmonary disease). Patient states burning and numbness with left hand and cannot hold anything, very painful worsen in the last two weeks. Patient has not address with pcp. Please advise.   Reason for Disposition  [1] Numbness or tingling in one or both hands AND [2] is a chronic symptom (recurrent or ongoing AND present > 4 weeks)  Answer Assessment - Initial Assessment Questions Pt sees Dr. Randine with [facility], no longer followed by Dr. Jolee. Pt to f/u with Dr. Randine for worsening neuropathy. Needing new patient appt for COPD with pulm; pt referred by [facility].    1. SYMPTOM: What is the main symptom you are concerned about? (e.g., weakness, numbness)     Numbness and burning x 2 weeks when trying to use L hand   2. ONSET: When did this start? (e.g., minutes, hours, days; while sleeping)      2 weeks   3. LAST NORMAL: When was the last time you (the patient) were normal (no symptoms)?     2 weeks ago; pt does have neuropathy and on gabapentin    4. PATTERN Does this come and go, or has it been constant since it started?  Is it present now?     Comes and goes; only present when using L hand   5. CARDIAC SYMPTOMS: Have you had any of the following symptoms: chest pain, difficulty breathing, palpitations?     No   6. NEUROLOGIC SYMPTOMS: Have you had any of  the following symptoms: headache, dizziness, vision loss, double vision, changes in speech, unsteady on your feet?     None   7. OTHER SYMPTOMS: Do you have any other symptoms?     No  Protocols used: Neurologic Deficit-A-AH

## 2024-02-22 NOTE — Telephone Encounter (Signed)
 Patient has an appointment on 1/26 with dr.mannam

## 2024-03-11 ENCOUNTER — Ambulatory Visit: Payer: Self-pay | Admitting: Pulmonary Disease

## 2024-04-08 ENCOUNTER — Ambulatory Visit: Payer: Self-pay | Admitting: Pulmonary Disease

## 2024-04-17 ENCOUNTER — Ambulatory Visit: Admitting: Podiatry
